# Patient Record
Sex: Male | Born: 1950
Health system: Southern US, Community
[De-identification: ages and names within clinical notes are randomized; demographics above are authoritative.]

## PROBLEM LIST (undated history)

## (undated) DIAGNOSIS — N400 Enlarged prostate without lower urinary tract symptoms: Secondary | ICD-10-CM

## (undated) DIAGNOSIS — I10 Essential (primary) hypertension: Secondary | ICD-10-CM

## (undated) DIAGNOSIS — E119 Type 2 diabetes mellitus without complications: Secondary | ICD-10-CM

## (undated) DIAGNOSIS — R809 Proteinuria, unspecified: Secondary | ICD-10-CM

## (undated) DIAGNOSIS — D573 Sickle-cell trait: Secondary | ICD-10-CM

## (undated) DIAGNOSIS — E782 Mixed hyperlipidemia: Secondary | ICD-10-CM

---

## 1997-11-07 ENCOUNTER — Ambulatory Visit (HOSPITAL_COMMUNITY): Admission: RE | Admit: 1997-11-07 | Discharge: 1997-11-07 | Payer: Self-pay | Admitting: Gastroenterology

## 1998-08-20 ENCOUNTER — Encounter: Admission: RE | Admit: 1998-08-20 | Discharge: 1998-11-18 | Payer: Self-pay | Admitting: Family Medicine

## 1998-12-03 ENCOUNTER — Encounter: Admission: RE | Admit: 1998-12-03 | Discharge: 1999-03-03 | Payer: Self-pay | Admitting: Family Medicine

## 1999-07-05 ENCOUNTER — Encounter: Payer: Self-pay | Admitting: Gastroenterology

## 1999-07-05 ENCOUNTER — Ambulatory Visit (HOSPITAL_COMMUNITY): Admission: RE | Admit: 1999-07-05 | Discharge: 1999-07-05 | Payer: Self-pay | Admitting: Gastroenterology

## 2000-10-05 ENCOUNTER — Encounter: Admission: RE | Admit: 2000-10-05 | Discharge: 2000-10-05 | Payer: Self-pay | Admitting: Surgery

## 2000-10-05 ENCOUNTER — Encounter: Payer: Self-pay | Admitting: Surgery

## 2001-03-20 ENCOUNTER — Emergency Department (HOSPITAL_COMMUNITY): Admission: EM | Admit: 2001-03-20 | Discharge: 2001-03-21 | Payer: Self-pay | Admitting: Emergency Medicine

## 2003-06-18 ENCOUNTER — Emergency Department (HOSPITAL_COMMUNITY): Admission: EM | Admit: 2003-06-18 | Discharge: 2003-06-18 | Payer: Self-pay | Admitting: Emergency Medicine

## 2013-05-26 ENCOUNTER — Encounter (HOSPITAL_COMMUNITY): Payer: Self-pay | Admitting: Emergency Medicine

## 2013-05-26 ENCOUNTER — Emergency Department (HOSPITAL_COMMUNITY)
Admission: EM | Admit: 2013-05-26 | Discharge: 2013-05-26 | Disposition: A | Discharge status: Home / Self Care | Payer: Self-pay | Attending: Emergency Medicine | Admitting: Emergency Medicine

## 2013-05-26 DIAGNOSIS — H6093 Unspecified otitis externa, bilateral: Secondary | ICD-10-CM

## 2013-05-26 DIAGNOSIS — H60399 Other infective otitis externa, unspecified ear: Secondary | ICD-10-CM | POA: Insufficient documentation

## 2013-05-26 DIAGNOSIS — Z23 Encounter for immunization: Secondary | ICD-10-CM | POA: Insufficient documentation

## 2013-05-26 DIAGNOSIS — H9190 Unspecified hearing loss, unspecified ear: Secondary | ICD-10-CM | POA: Insufficient documentation

## 2013-05-26 MED ORDER — OFLOXACIN 0.3 % OT SOLN: 10.0000 [drp] | Freq: Every day | OTIC | Status: DC

## 2013-05-26 MED ORDER — TETANUS-DIPHTH-ACELL PERTUSSIS 5-2.5-18.5 LF-MCG/0.5 IM SUSP
0.5000 mL | Freq: Once | INTRAMUSCULAR | Status: AC
  Administered 2013-05-26: 0.5 mL via INTRAMUSCULAR
  Filled 2013-05-26: qty 0.5

## 2013-05-26 NOTE — Discharge Instructions (Signed)
1. Medications: ofloxacin ear drops, usual home medications 2. Treatment: rest, drink plenty of fluids, after abx is complete wash ears with bulb syringe and 1/2 water + 1/2 peroxide 3. Follow Up: Please followup with your primary doctor for discussion of your diagnoses and further evaluation after today's visit;  Otitis Externa Otitis externa is a bacterial or fungal infection of the outer ear canal. This is the area from the eardrum to the outside of the ear. Otitis externa is sometimes called "swimmer's ear." CAUSES  Possible causes of infection include:  Swimming in dirty water.  Moisture remaining in the ear after swimming or bathing.  Mild injury (trauma) to the ear.  Objects stuck in the ear (foreign body).  Cuts or scrapes (abrasions) on the outside of the ear. SYMPTOMS  The first symptom of infection is often itching in the ear canal. Later signs and symptoms may include swelling and redness of the ear canal, ear pain, and yellowish-white fluid (pus) coming from the ear. The ear pain may be worse when pulling on the earlobe. DIAGNOSIS  Your caregiver will perform a physical exam. A sample of fluid may be taken from the ear and examined for bacteria or fungi. TREATMENT  Antibiotic ear drops are often given for 10 to 14 days. Treatment may also include pain medicine or corticosteroids to reduce itching and swelling. PREVENTION   Keep your ear dry. Use the corner of a towel to absorb water out of the ear canal after swimming or bathing.  Avoid scratching or putting objects inside your ear. This can damage the ear canal or remove the protective wax that lines the canal. This makes it easier for bacteria and fungi to grow.  Avoid swimming in lakes, polluted water, or poorly chlorinated pools.  You may use ear drops made of rubbing alcohol and vinegar after swimming. Combine equal parts of white vinegar and alcohol in a bottle. Put 3 or 4 drops into each ear after swimming. HOME  CARE INSTRUCTIONS   Apply antibiotic ear drops to the ear canal as prescribed by your caregiver.  Only take over-the-counter or prescription medicines for pain, discomfort, or fever as directed by your caregiver.  If you have diabetes, follow any additional treatment instructions from your caregiver.  Keep all follow-up appointments as directed by your caregiver. SEEK MEDICAL CARE IF:   You have a fever.  Your ear is still red, swollen, painful, or draining pus after 3 days.  Your redness, swelling, or pain gets worse.  You have a severe headache.  You have redness, swelling, pain, or tenderness in the area behind your ear. MAKE SURE YOU:   Understand these instructions.  Will watch your condition.  Will get help right away if you are not doing well or get worse. Document Released: 04/14/2005 Document Revised: 07/07/2011 Document Reviewed: 05/01/2011 [facility] Patient Information 2014 [place].

## 2013-05-26 NOTE — ED Notes (Signed)
PT ambulated with baseline gait; VSS; A&Ox3; no signs of distress; respirations even and unlabored; skin warm and dry; no questions upon discharge.  

## 2013-05-26 NOTE — ED Notes (Signed)
Pt has been using qtips, bobby pins, and hydrogen peroxide to clean ears. Pt says he just woke up and felt congestion in ears. External ear canal is scrapped and bleeding and scabbed.

## 2013-05-26 NOTE — ED Provider Notes (Signed)
CSN: [id number]     Arrival date & time 05/26/13  1538 History  This chart was scribed for non-physician practitioner Abigail Butts, PA-C, working with Virgel Manifold, MD by Zettie Pho, ED Scribe. This patient was seen in room TR11C/TR11C and the patient's care was started at 4:31 PM.    Chief Complaint  Patient presents with  . Otalgia   The history is provided by the patient and medical records. No language interpreter was used.   HPI Comments: Miguel Boone is a 63 y.o. male who presents to the Emergency Department complaining of a constant pain and a congestion/swelling-like feeling in the bilateral ears with associated mildly decreased hearing onset yesterday. Patient states that a squirrel recently died in his attic and expresses that the smell may be the cause of his symptoms. Patient reports using hydrogen peroxide, Q-tips, and bobby pins in the ears to clean them with mild, temporary relief. He states that he left the peroxide in his ears over night. Patient presents with scabbing secondary to his attempts to clean them. He denies drainage from the area. Patient states that he does not swim and denies getting any water in his ears recently. He denies nasal congestion, sore throat, fever, chills. He states that he does not know when his last tetanus vaccination was. Patient has no other pertinent medical history.    History reviewed. No pertinent past medical history. History reviewed. No pertinent past surgical history. History reviewed. No pertinent family history. History  Substance Use Topics  . Smoking status: Never Smoker   . Smokeless tobacco: Not on file  . Alcohol Use: No    Review of Systems  Constitutional: Negative for fever and chills.  HENT: Positive for congestion (ears), ear pain and hearing loss. Negative for ear discharge and sore throat.   All other systems reviewed and are negative.    Allergies  Review of patient's allergies indicates no known  allergies.  Home Medications   Current Outpatient Rx  Name  Route  Sig  Dispense  Refill  . ofloxacin (FLOXIN) 0.3 % otic solution   Both Ears   Place 10 drops into both ears daily.   5 mL   0     Triage Vitals: BP 135/73  Pulse 90  Temp(Src) 97.9 F (36.6 C) (Oral)  Resp 18  SpO2 99%  Physical Exam  Nursing note and vitals reviewed. Constitutional: He appears well-developed and well-nourished. No distress.  Awake, alert, nontoxic appearance  HENT:  Head: Normocephalic and atraumatic.  Right Ear: There is swelling and tenderness.  Left Ear: There is swelling and tenderness.  Mouth/Throat: Oropharynx is clear and moist. No oropharyngeal exudate.  Bilateral maceration to the bilateral ear canals with erythema, mild drainage. Neither canal is occluded, but TMs are only partially visible. There are visible abrasions and scratches to the ear canal. No mastoid tenderness. Hearing grossly intact   Eyes: Conjunctivae are normal. No scleral icterus.  Neck: Normal range of motion. Neck supple.  Cardiovascular: Normal rate, regular rhythm, normal heart sounds and intact distal pulses.   No murmur heard. No tachycardia  Pulmonary/Chest: Effort normal and breath sounds normal. No respiratory distress. He has no wheezes.  Abdominal: Soft. Bowel sounds are normal. There is no tenderness.  Musculoskeletal: Normal range of motion. He exhibits no edema.  Neurological: He is alert.  Speech is clear and goal oriented Moves extremities without ataxia  Skin: Skin is warm and dry. He is not diaphoretic.  Psychiatric: He has a  normal mood and affect.    ED Course  Procedures (including critical care time)  DIAGNOSTIC STUDIES: Oxygen Saturation is 99% on room air, normal by my interpretation.    COORDINATION OF CARE: 4:34 PM- Discussed that there appears to be an infection on the external part of the ear. Will discharge patient with antibiotic ear drops. Will order a tetanus vaccination.  Advised patient to stop using bobby pins in the ears. Advised patient to use a mixture of hydrogen peroxide and water to rinse the ears after the infection has healed. Advised patient to follow up with his PCP to ensure proper healing. Return precautions given. Discussed treatment plan with patient at bedside and patient verbalized agreement.     Labs Review Labs Reviewed - No data to display Imaging Review No results found.  EKG Interpretation   None       MDM   1. Otitis externa of both ears      Miguel Boone presents with bilateral otalgia after using a bobby pin to clean his ears.  He then applied peroxide to his ear canal and went to sleep with it in.  On exam pt with macerated skin in the canal and noted abrasions to the walls of the canal.  Will update tetanus.  No canal occlusion, swelling not severe enough to require ear wick.  Pt afebrile in NAD.  Exam non concerning for mastoiditis, cellulitis or malignant OE.  Dc with ofloxacin script.  Advised pt to follow up in 2-3 days with PCP especially if no improvement with treatment or no complete resolution by 7 days. Pt is to return to ED if he develops fever, pain in the mastoid region or loss of hearing.    It has been determined that no acute conditions requiring further emergency intervention are present at this time. The patient/guardian have been advised of the diagnosis and plan. We have discussed signs and symptoms that warrant return to the ED, such as changes or worsening in symptoms.   Vital signs are stable at discharge.   BP 135/73  Pulse 90  Temp(Src) 97.9 F (36.6 C) (Oral)  Resp 18  SpO2 99%  Patient/guardian has voiced understanding and agreed to follow-up with the PCP or specialist.      Abigail Butts, PA-C 05/26/13 1654

## 2013-05-27 NOTE — ED Provider Notes (Signed)
Medical screening examination/treatment/procedure(s) were performed by non-physician practitioner and as supervising physician I was immediately available for consultation/collaboration.  EKG Interpretation   None        Virgel Manifold, MD 05/27/13 [phone]

## 2015-03-16 DIAGNOSIS — E782 Mixed hyperlipidemia: Secondary | ICD-10-CM | POA: Diagnosis not present

## 2015-03-16 DIAGNOSIS — Z0000 Encounter for general adult medical examination without abnormal findings: Secondary | ICD-10-CM | POA: Diagnosis not present

## 2015-03-16 DIAGNOSIS — Z6841 Body Mass Index (BMI) 40.0 and over, adult: Secondary | ICD-10-CM | POA: Diagnosis not present

## 2015-04-26 DIAGNOSIS — E785 Hyperlipidemia, unspecified: Secondary | ICD-10-CM | POA: Diagnosis not present

## 2015-04-26 DIAGNOSIS — E118 Type 2 diabetes mellitus with unspecified complications: Secondary | ICD-10-CM | POA: Diagnosis not present

## 2015-05-03 DIAGNOSIS — Z0000 Encounter for general adult medical examination without abnormal findings: Secondary | ICD-10-CM | POA: Diagnosis not present

## 2015-05-03 DIAGNOSIS — I10 Essential (primary) hypertension: Secondary | ICD-10-CM | POA: Diagnosis not present

## 2015-05-03 DIAGNOSIS — E782 Mixed hyperlipidemia: Secondary | ICD-10-CM | POA: Diagnosis not present

## 2015-05-03 DIAGNOSIS — Z23 Encounter for immunization: Secondary | ICD-10-CM | POA: Diagnosis not present

## 2015-05-03 DIAGNOSIS — N400 Enlarged prostate without lower urinary tract symptoms: Secondary | ICD-10-CM | POA: Diagnosis not present

## 2015-09-22 ENCOUNTER — Emergency Department (HOSPITAL_COMMUNITY)
Admission: EM | Admit: 2015-09-22 | Discharge: 2015-09-22 | Disposition: A | Discharge status: Home / Self Care | Payer: Medicare HMO | Attending: Emergency Medicine | Admitting: Emergency Medicine

## 2015-09-22 ENCOUNTER — Emergency Department (HOSPITAL_COMMUNITY): Payer: Medicare HMO

## 2015-09-22 ENCOUNTER — Encounter (HOSPITAL_COMMUNITY): Payer: Self-pay

## 2015-09-22 DIAGNOSIS — Z792 Long term (current) use of antibiotics: Secondary | ICD-10-CM | POA: Diagnosis not present

## 2015-09-22 DIAGNOSIS — R05 Cough: Secondary | ICD-10-CM | POA: Diagnosis not present

## 2015-09-22 DIAGNOSIS — R059 Cough, unspecified: Secondary | ICD-10-CM

## 2015-09-22 DIAGNOSIS — B369 Superficial mycosis, unspecified: Secondary | ICD-10-CM | POA: Diagnosis not present

## 2015-09-22 DIAGNOSIS — H6240 Otitis externa in other diseases classified elsewhere, unspecified ear: Secondary | ICD-10-CM

## 2015-09-22 MED ORDER — BENZONATATE 100 MG PO CAPS: 100.0000 mg | ORAL_CAPSULE | Freq: Three times a day (TID) | ORAL | Status: DC

## 2015-09-22 MED ORDER — CLOTRIMAZOLE 1 % EX SOLN: 1.0000 "application " | Freq: Two times a day (BID) | CUTANEOUS | Status: DC

## 2015-09-22 MED ORDER — CETIRIZINE HCL 10 MG PO CAPS: 10.0000 mg | ORAL_CAPSULE | Freq: Every day | ORAL | Status: DC

## 2015-09-22 NOTE — Discharge Instructions (Signed)
Cough, Adult A cough helps to clear your throat and lungs. A cough may last only 2-3 weeks (acute), or it may last longer than 8 weeks (chronic). Many different things can cause a cough. A cough may be a sign of an illness or another medical condition. HOME CARE  Pay attention to any changes in your cough.  Take medicines only as told by your doctor.  If you were prescribed an antibiotic medicine, take it as told by your doctor. Do not stop taking it even if you start to feel better.  Talk with your doctor before you try using a cough medicine.  Drink enough fluid to keep your pee (urine) clear or pale yellow.  If the air is dry, use a cold steam vaporizer or humidifier in your home.  Stay away from things that make you cough at work or at home.  If your cough is worse at night, try using extra pillows to raise your head up higher while you sleep.  Do not smoke, and try not to be around smoke. If you need help quitting, ask your doctor.  Do not have caffeine.  Do not drink alcohol.  Rest as needed. GET HELP IF:  You have new problems (symptoms).  You cough up yellow fluid (pus).  Your cough does not get better after 2-3 weeks, or your cough gets worse.  Medicine does not help your cough and you are not sleeping well.  You have pain that gets worse or pain that is not helped with medicine.  You have a fever.  You are losing weight and you do not know why.  You have night sweats. GET HELP RIGHT AWAY IF:  You cough up blood.  You have trouble breathing.  Your heartbeat is very fast.   This information is not intended to replace advice given to you by your health care provider. Make sure you discuss any questions you have with your health care provider.    Apply antifungal solution to ears twice daily for 10-14 days. Follow up with your PCP for re-evaluation. Take Tessalon as needed for cough. Recommend taking Zyrtec daily for seasonal allergies. Return to the ED if  you experience severe worsening of your symptoms, fevers, chills difficulty breathing.

## 2015-09-22 NOTE — ED Provider Notes (Signed)
CSN: [id number]     Arrival date & time 09/22/15  1542 History   First MD Initiated Contact with Patient 09/22/15 1603     Chief Complaint  Patient presents with  . Cough     (Consider location/radiation/quality/duration/timing/severity/associated sxs/prior Treatment) HPI   Miguel Boone is a 65 y.o M with no significant pmhx who presents to the ED today c/o cough and itchy ears.Patient states that for the last 3 days he has had a productive cough with clear mucus. He states the cough is worse at nighttime and has been keeping him up. He has tried taking Robitussin without relief of his symptoms. He denies any fevers, chills, sore throat, rhinorrhea, chest pain or shortness of breath. Patient also states that about 6 months ago he had a bilateral ear infection. He was given antibiotics which seemed to help the pain but since that time he has had significant itchiness in both of his ears. He denies any drainage from his ears or pain in the bone behind his ear.  History reviewed. No pertinent past medical history. History reviewed. No pertinent past surgical history. No family history on file. Social History  Substance Use Topics  . Smoking status: Never Smoker   . Smokeless tobacco: None  . Alcohol Use: No    Review of Systems  All other systems reviewed and are negative.     Allergies  Review of patient's allergies indicates no known allergies.  Home Medications   Prior to Admission medications   Medication Sig Start Date End Date Taking? Authorizing Provider  benzonatate (TESSALON) 100 MG capsule Take 1 capsule (100 mg total) by mouth every 8 (eight) hours. 09/22/15   Samantha Tripp [place], PA-C  Cetirizine HCl 10 MG CAPS Take 1 capsule (10 mg total) by mouth daily. 09/22/15   Samantha Tripp Dowless, PA-C  clotrimazole (LOTRIMIN) 1 % external solution Apply 1 application topically 2 (two) times daily. 09/22/15   Samantha Tripp Dowless, PA-C  ofloxacin (FLOXIN) 0.3 % otic  solution Place 10 drops into both ears daily. 05/26/13   Hannah Muthersbaugh, PA-C   BP 141/78 mmHg  Pulse 66  Temp(Src) 98.5 F (36.9 C) (Oral)  Resp 16  Ht 6\' 1"  (1.854 m)  Wt 138.857 kg  BMI 40.40 kg/m2  SpO2 95% Physical Exam  Constitutional: He is oriented to person, place, and time. He appears well-developed and well-nourished. No distress.  HENT:  Head: Normocephalic and atraumatic.  Nose: Nose normal.  Mouth/Throat: No oropharyngeal exudate.  greyish white debris in bilateral ear canals. No canal swelling. TMs clear bilaterally.   Eyes: Conjunctivae are normal. Right eye exhibits no discharge. Left eye exhibits no discharge. No scleral icterus.  Neck: Neck supple.  Cardiovascular: Normal rate, regular rhythm, normal heart sounds and intact distal pulses.  Exam reveals no gallop and no friction rub.   No murmur heard. Pulmonary/Chest: Effort normal and breath sounds normal. No respiratory distress. He has no wheezes. He has no rales. He exhibits no tenderness.  Lymphadenopathy:    He has no cervical adenopathy.  Neurological: He is alert and oriented to person, place, and time. Coordination normal.  Skin: Skin is warm and dry. No rash noted. He is not diaphoretic. No erythema. No pallor.  Psychiatric: He has a normal mood and affect. His behavior is normal.  Nursing note and vitals reviewed.   ED Course  Procedures (including critical care time) Labs Review Labs Reviewed - No data to display  Imaging Review Dg Chest  2 View  09/22/2015  CLINICAL DATA:  Cough EXAM: CHEST  2 VIEW COMPARISON:  06/18/2003 chest radiograph. FINDINGS: Stable cardiomediastinal silhouette with normal heart size. No pneumothorax. No pleural effusion. Lungs appear clear, with no acute consolidative airspace disease and no pulmonary edema. IMPRESSION: No active cardiopulmonary disease. Electronically Signed   By: Ilona Sorrel M.D.   On: 09/22/2015 16:35   I have personally reviewed and evaluated  these images and lab results as part of my medical decision-making.   EKG Interpretation None      MDM   Final diagnoses:  Cough  Otomycosis   Otherwise healthy 64 red male presents the ED today complaining of productive cough with clear sputum onset 2 days ago. Patient appears well in the ED, nontoxic, nonseptic appearing. Vital signs are stable. Lungs are clear to auscultation bilaterally. Chest x-ray negative for infiltrate. Suspect URI, likely viral or allergic in etiology. No antibiotic indication at this time. Will prescribe Tessalon Perles for symptomatic relief. Patient ALSO complaining of pruritus in his bilateral ears ongoing for 6 months after being treated with antibiotics for otitis media. There is grayish white debris in his bilateral ear canals without swelling. Since this followed antibiotics it's possible this is a fungal infection. Will prescribe antifungal otic suspension. Recommend follow-up with PCP for further evaluation. Doubt that this is bacterial as that is not painful and there is no swelling. No mastoid tenderness. No evidence of mastoiditis or meningitis. Discussed treatment plan with patient who is agreeable. Return precautions outlined in patient discharge instructions.    Dondra Spry [place], PA-C 09/22/15 2135  Ezequiel Essex, MD 09/22/15 2138

## 2015-09-22 NOTE — ED Notes (Signed)
Patient is alert and orientedx4.  Patient was explained discharge instructions and they understood them with no questions.   

## 2015-09-22 NOTE — ED Notes (Signed)
Patient here with dry cough x 2 days with ears feeling stopped up. Cough worse at night when supine

## 2015-09-22 NOTE — ED Notes (Signed)
Patient transported to X-ray 

## 2015-10-29 DIAGNOSIS — Z09 Encounter for follow-up examination after completed treatment for conditions other than malignant neoplasm: Secondary | ICD-10-CM | POA: Diagnosis not present

## 2016-05-29 DIAGNOSIS — Z0000 Encounter for general adult medical examination without abnormal findings: Secondary | ICD-10-CM | POA: Diagnosis not present

## 2016-05-29 DIAGNOSIS — I10 Essential (primary) hypertension: Secondary | ICD-10-CM | POA: Diagnosis not present

## 2016-05-29 DIAGNOSIS — Z125 Encounter for screening for malignant neoplasm of prostate: Secondary | ICD-10-CM | POA: Diagnosis not present

## 2016-05-29 DIAGNOSIS — E782 Mixed hyperlipidemia: Secondary | ICD-10-CM | POA: Diagnosis not present

## 2016-05-29 DIAGNOSIS — E119 Type 2 diabetes mellitus without complications: Secondary | ICD-10-CM | POA: Diagnosis not present

## 2016-06-05 DIAGNOSIS — E118 Type 2 diabetes mellitus with unspecified complications: Secondary | ICD-10-CM | POA: Diagnosis not present

## 2016-06-05 DIAGNOSIS — E782 Mixed hyperlipidemia: Secondary | ICD-10-CM | POA: Diagnosis not present

## 2016-06-05 DIAGNOSIS — E1122 Type 2 diabetes mellitus with diabetic chronic kidney disease: Secondary | ICD-10-CM | POA: Diagnosis not present

## 2016-06-05 DIAGNOSIS — I10 Essential (primary) hypertension: Secondary | ICD-10-CM | POA: Diagnosis not present

## 2016-07-15 DIAGNOSIS — M25562 Pain in left knee: Secondary | ICD-10-CM | POA: Diagnosis not present

## 2016-07-15 DIAGNOSIS — M1712 Unilateral primary osteoarthritis, left knee: Secondary | ICD-10-CM | POA: Diagnosis not present

## 2016-07-16 DIAGNOSIS — M1990 Unspecified osteoarthritis, unspecified site: Secondary | ICD-10-CM | POA: Diagnosis not present

## 2016-07-16 DIAGNOSIS — M25562 Pain in left knee: Secondary | ICD-10-CM | POA: Diagnosis not present

## 2017-01-12 DIAGNOSIS — E782 Mixed hyperlipidemia: Secondary | ICD-10-CM | POA: Diagnosis not present

## 2017-01-12 DIAGNOSIS — E118 Type 2 diabetes mellitus with unspecified complications: Secondary | ICD-10-CM | POA: Diagnosis not present

## 2017-01-19 DIAGNOSIS — I129 Hypertensive chronic kidney disease with stage 1 through stage 4 chronic kidney disease, or unspecified chronic kidney disease: Secondary | ICD-10-CM | POA: Diagnosis not present

## 2017-01-19 DIAGNOSIS — E1122 Type 2 diabetes mellitus with diabetic chronic kidney disease: Secondary | ICD-10-CM | POA: Diagnosis not present

## 2017-01-19 DIAGNOSIS — E782 Mixed hyperlipidemia: Secondary | ICD-10-CM | POA: Diagnosis not present

## 2017-01-19 DIAGNOSIS — E1165 Type 2 diabetes mellitus with hyperglycemia: Secondary | ICD-10-CM | POA: Diagnosis not present

## 2017-07-06 DIAGNOSIS — Z125 Encounter for screening for malignant neoplasm of prostate: Secondary | ICD-10-CM | POA: Diagnosis not present

## 2017-07-06 DIAGNOSIS — E782 Mixed hyperlipidemia: Secondary | ICD-10-CM | POA: Diagnosis not present

## 2017-07-06 DIAGNOSIS — E1122 Type 2 diabetes mellitus with diabetic chronic kidney disease: Secondary | ICD-10-CM | POA: Diagnosis not present

## 2017-07-06 DIAGNOSIS — I129 Hypertensive chronic kidney disease with stage 1 through stage 4 chronic kidney disease, or unspecified chronic kidney disease: Secondary | ICD-10-CM | POA: Diagnosis not present

## 2017-07-06 DIAGNOSIS — Z0000 Encounter for general adult medical examination without abnormal findings: Secondary | ICD-10-CM | POA: Diagnosis not present

## 2017-07-13 DIAGNOSIS — N181 Chronic kidney disease, stage 1: Secondary | ICD-10-CM | POA: Diagnosis not present

## 2017-07-13 DIAGNOSIS — I10 Essential (primary) hypertension: Secondary | ICD-10-CM | POA: Diagnosis not present

## 2017-07-13 DIAGNOSIS — R809 Proteinuria, unspecified: Secondary | ICD-10-CM | POA: Diagnosis not present

## 2017-07-13 DIAGNOSIS — N400 Enlarged prostate without lower urinary tract symptoms: Secondary | ICD-10-CM | POA: Diagnosis not present

## 2017-07-13 DIAGNOSIS — E118 Type 2 diabetes mellitus with unspecified complications: Secondary | ICD-10-CM | POA: Diagnosis not present

## 2017-07-13 DIAGNOSIS — E1122 Type 2 diabetes mellitus with diabetic chronic kidney disease: Secondary | ICD-10-CM | POA: Diagnosis not present

## 2017-07-13 DIAGNOSIS — D573 Sickle-cell trait: Secondary | ICD-10-CM | POA: Diagnosis not present

## 2017-07-13 DIAGNOSIS — E782 Mixed hyperlipidemia: Secondary | ICD-10-CM | POA: Diagnosis not present

## 2017-07-14 DIAGNOSIS — Z1212 Encounter for screening for malignant neoplasm of rectum: Secondary | ICD-10-CM | POA: Diagnosis not present

## 2017-07-14 DIAGNOSIS — Z1211 Encounter for screening for malignant neoplasm of colon: Secondary | ICD-10-CM | POA: Diagnosis not present

## 2017-12-01 DIAGNOSIS — Z0100 Encounter for examination of eyes and vision without abnormal findings: Secondary | ICD-10-CM | POA: Diagnosis not present

## 2017-12-01 DIAGNOSIS — H524 Presbyopia: Secondary | ICD-10-CM | POA: Diagnosis not present

## 2018-01-07 DIAGNOSIS — E118 Type 2 diabetes mellitus with unspecified complications: Secondary | ICD-10-CM | POA: Diagnosis not present

## 2018-01-07 DIAGNOSIS — N181 Chronic kidney disease, stage 1: Secondary | ICD-10-CM | POA: Diagnosis not present

## 2018-01-07 DIAGNOSIS — E782 Mixed hyperlipidemia: Secondary | ICD-10-CM | POA: Diagnosis not present

## 2018-01-14 DIAGNOSIS — D573 Sickle-cell trait: Secondary | ICD-10-CM | POA: Diagnosis not present

## 2018-01-14 DIAGNOSIS — E782 Mixed hyperlipidemia: Secondary | ICD-10-CM | POA: Diagnosis not present

## 2018-01-14 DIAGNOSIS — E118 Type 2 diabetes mellitus with unspecified complications: Secondary | ICD-10-CM | POA: Diagnosis not present

## 2018-01-14 DIAGNOSIS — I129 Hypertensive chronic kidney disease with stage 1 through stage 4 chronic kidney disease, or unspecified chronic kidney disease: Secondary | ICD-10-CM | POA: Diagnosis not present

## 2018-01-14 DIAGNOSIS — M25561 Pain in right knee: Secondary | ICD-10-CM | POA: Diagnosis not present

## 2018-01-14 DIAGNOSIS — E1122 Type 2 diabetes mellitus with diabetic chronic kidney disease: Secondary | ICD-10-CM | POA: Diagnosis not present

## 2018-01-14 DIAGNOSIS — I10 Essential (primary) hypertension: Secondary | ICD-10-CM | POA: Diagnosis not present

## 2018-01-25 DIAGNOSIS — M25569 Pain in unspecified knee: Secondary | ICD-10-CM | POA: Diagnosis not present

## 2018-01-25 DIAGNOSIS — M1990 Unspecified osteoarthritis, unspecified site: Secondary | ICD-10-CM | POA: Diagnosis not present

## 2018-01-25 DIAGNOSIS — M170 Bilateral primary osteoarthritis of knee: Secondary | ICD-10-CM | POA: Diagnosis not present

## 2018-01-28 IMAGING — DX DG CHEST 2V
2 series · 2 of 2 positions shown · non-contrast
Comparison: 06/18/2003 chest radiograph.

CLINICAL DATA: Cough

EXAM:
CHEST  2 VIEW

[w chest pa]
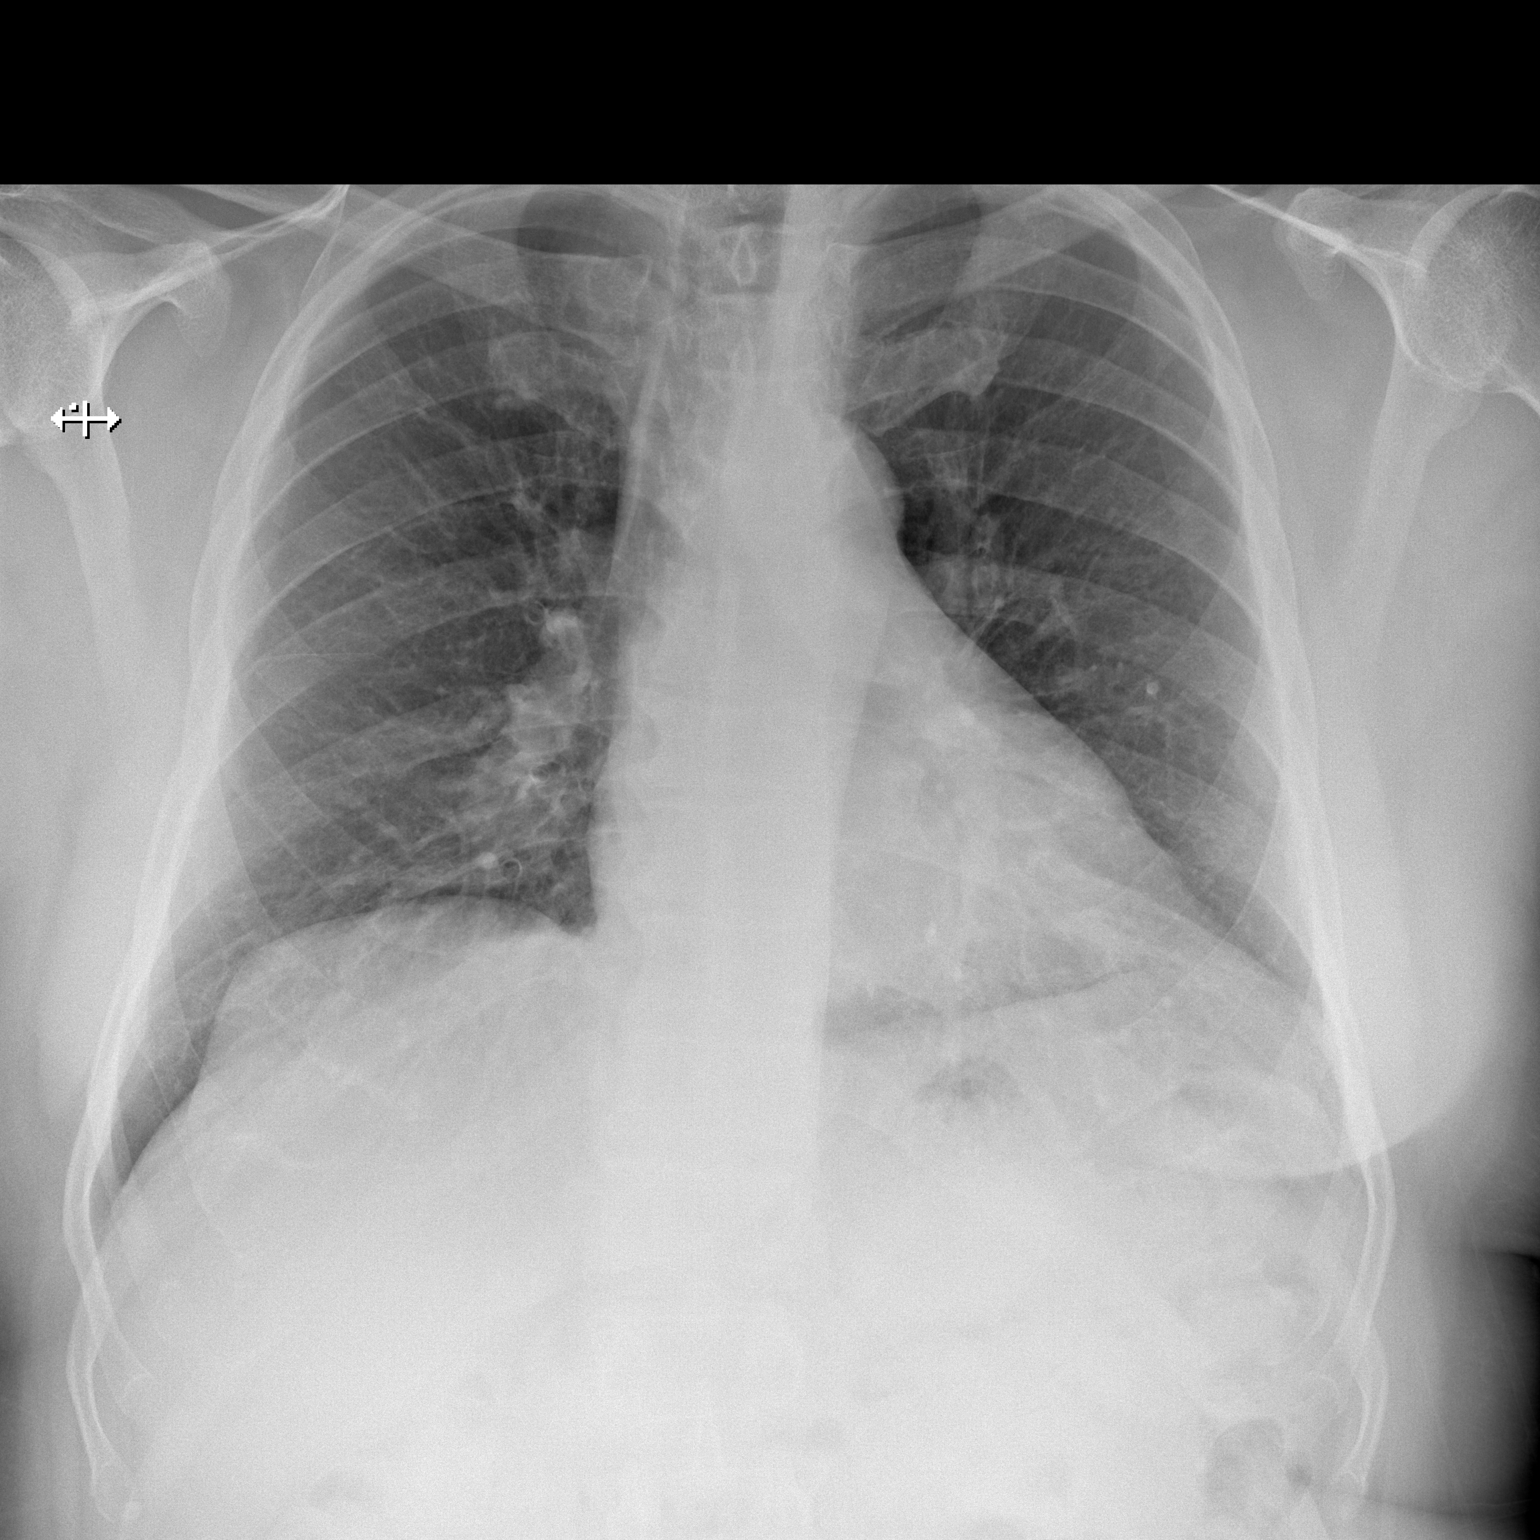

[w chest lat]
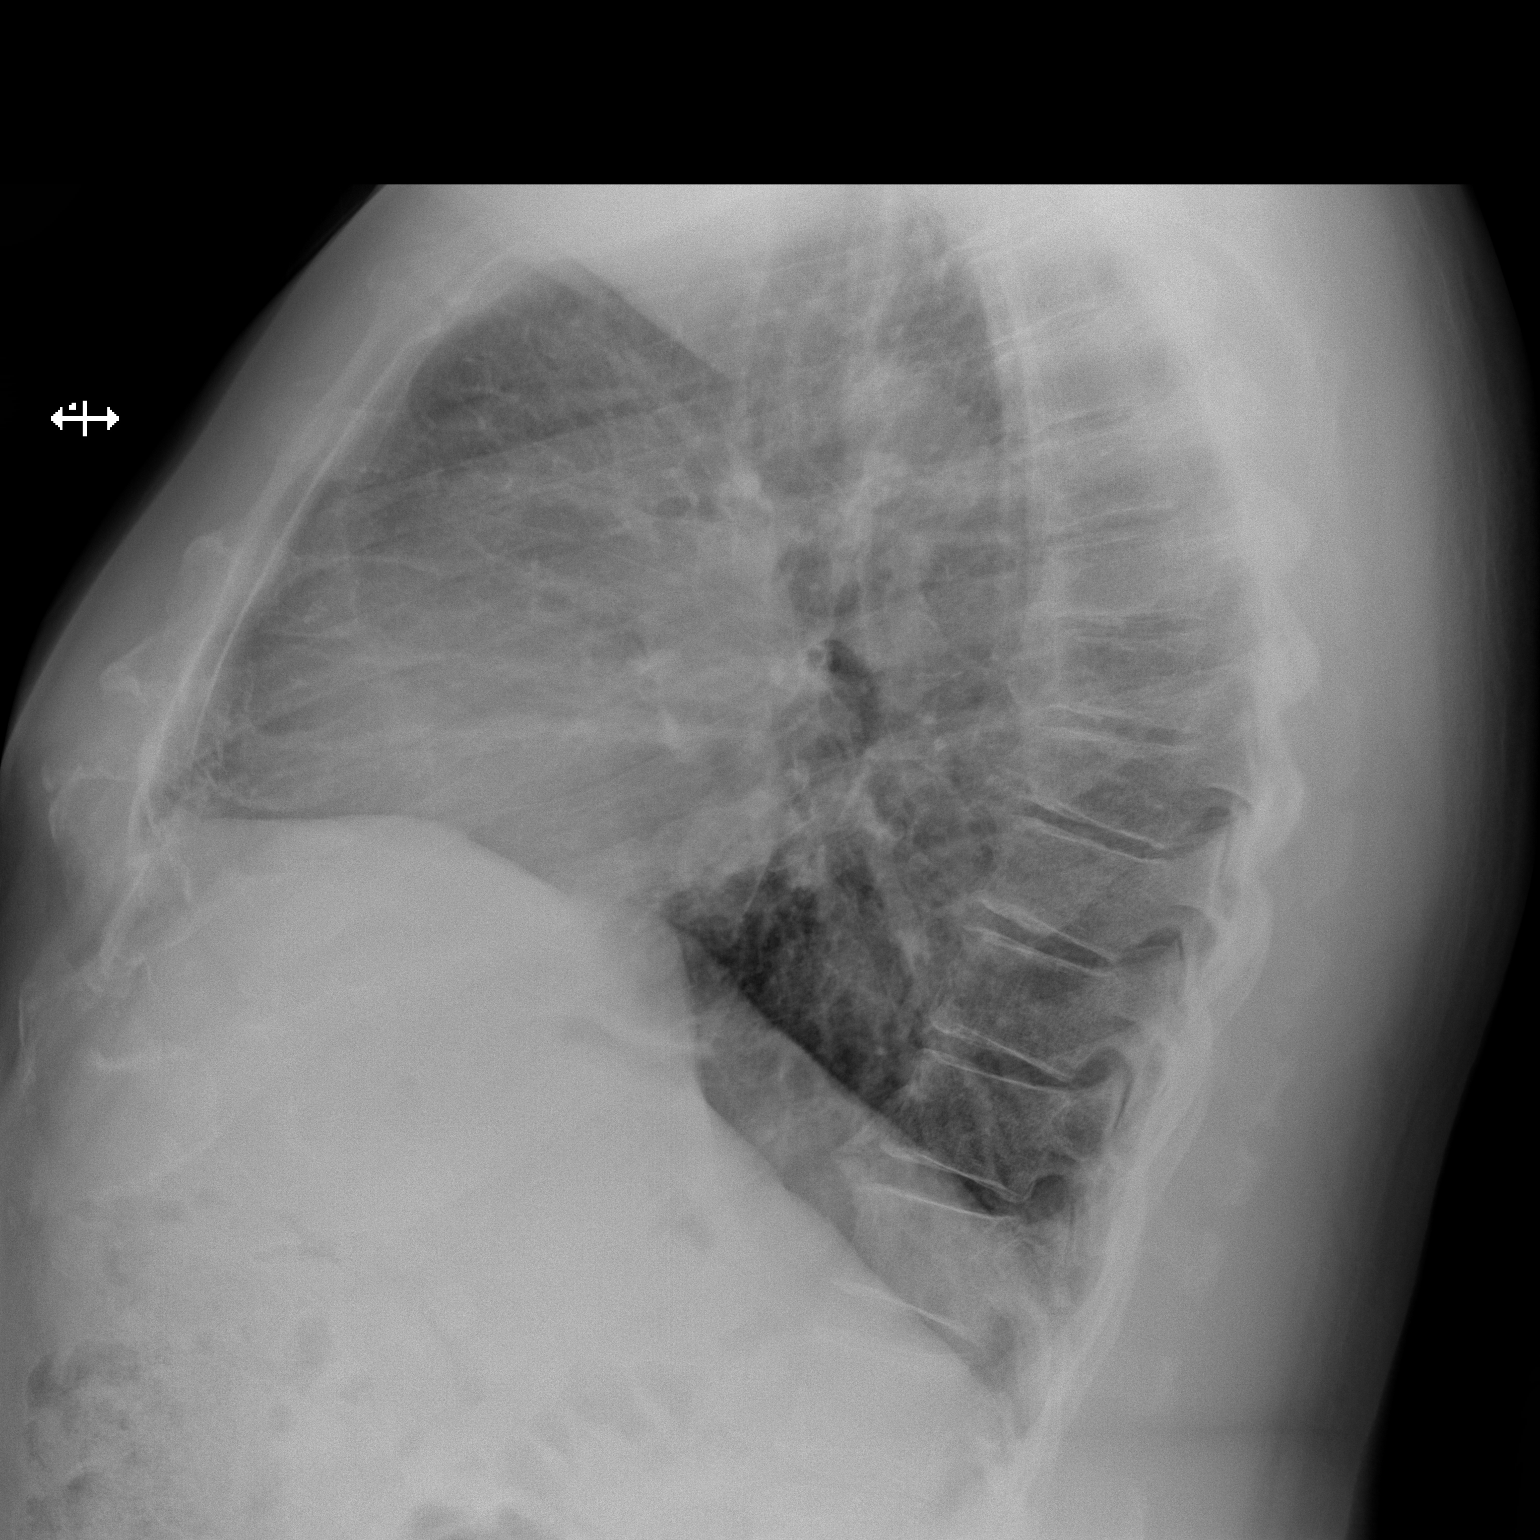

[2 of 2 positions shown; findings below may reference images not displayed]

FINDINGS: Stable cardiomediastinal silhouette with normal heart size. No
pneumothorax. No pleural effusion. Lungs appear clear, with no acute
consolidative airspace disease and no pulmonary edema.
IMPRESSION: No active cardiopulmonary disease.

## 2018-07-22 DIAGNOSIS — Z125 Encounter for screening for malignant neoplasm of prostate: Secondary | ICD-10-CM | POA: Diagnosis not present

## 2018-07-22 DIAGNOSIS — I129 Hypertensive chronic kidney disease with stage 1 through stage 4 chronic kidney disease, or unspecified chronic kidney disease: Secondary | ICD-10-CM | POA: Diagnosis not present

## 2018-07-22 DIAGNOSIS — E1122 Type 2 diabetes mellitus with diabetic chronic kidney disease: Secondary | ICD-10-CM | POA: Diagnosis not present

## 2018-07-22 DIAGNOSIS — E782 Mixed hyperlipidemia: Secondary | ICD-10-CM | POA: Diagnosis not present

## 2018-07-22 DIAGNOSIS — I10 Essential (primary) hypertension: Secondary | ICD-10-CM | POA: Diagnosis not present

## 2018-07-22 DIAGNOSIS — E1165 Type 2 diabetes mellitus with hyperglycemia: Secondary | ICD-10-CM | POA: Diagnosis not present

## 2018-07-22 DIAGNOSIS — D573 Sickle-cell trait: Secondary | ICD-10-CM | POA: Diagnosis not present

## 2018-07-22 DIAGNOSIS — N181 Chronic kidney disease, stage 1: Secondary | ICD-10-CM | POA: Diagnosis not present

## 2018-07-22 DIAGNOSIS — E118 Type 2 diabetes mellitus with unspecified complications: Secondary | ICD-10-CM | POA: Diagnosis not present

## 2018-07-22 DIAGNOSIS — Z0000 Encounter for general adult medical examination without abnormal findings: Secondary | ICD-10-CM | POA: Diagnosis not present

## 2018-08-25 DIAGNOSIS — E1165 Type 2 diabetes mellitus with hyperglycemia: Secondary | ICD-10-CM | POA: Diagnosis not present

## 2019-01-12 DIAGNOSIS — M1990 Unspecified osteoarthritis, unspecified site: Secondary | ICD-10-CM | POA: Diagnosis not present

## 2019-01-12 DIAGNOSIS — M25569 Pain in unspecified knee: Secondary | ICD-10-CM | POA: Diagnosis not present

## 2019-02-17 DIAGNOSIS — I10 Essential (primary) hypertension: Secondary | ICD-10-CM | POA: Diagnosis not present

## 2019-02-17 DIAGNOSIS — E118 Type 2 diabetes mellitus with unspecified complications: Secondary | ICD-10-CM | POA: Diagnosis not present

## 2019-02-24 DIAGNOSIS — E782 Mixed hyperlipidemia: Secondary | ICD-10-CM | POA: Diagnosis not present

## 2019-02-24 DIAGNOSIS — E118 Type 2 diabetes mellitus with unspecified complications: Secondary | ICD-10-CM | POA: Diagnosis not present

## 2019-02-24 DIAGNOSIS — E1165 Type 2 diabetes mellitus with hyperglycemia: Secondary | ICD-10-CM | POA: Diagnosis not present

## 2019-02-24 DIAGNOSIS — Z7189 Other specified counseling: Secondary | ICD-10-CM | POA: Diagnosis not present

## 2019-02-24 DIAGNOSIS — E1122 Type 2 diabetes mellitus with diabetic chronic kidney disease: Secondary | ICD-10-CM | POA: Diagnosis not present

## 2019-02-24 DIAGNOSIS — Z23 Encounter for immunization: Secondary | ICD-10-CM | POA: Diagnosis not present

## 2019-02-24 DIAGNOSIS — I10 Essential (primary) hypertension: Secondary | ICD-10-CM | POA: Diagnosis not present

## 2019-07-28 DIAGNOSIS — E118 Type 2 diabetes mellitus with unspecified complications: Secondary | ICD-10-CM | POA: Diagnosis not present

## 2019-07-28 DIAGNOSIS — E1122 Type 2 diabetes mellitus with diabetic chronic kidney disease: Secondary | ICD-10-CM | POA: Diagnosis not present

## 2019-07-28 DIAGNOSIS — Z0000 Encounter for general adult medical examination without abnormal findings: Secondary | ICD-10-CM | POA: Diagnosis not present

## 2019-07-28 DIAGNOSIS — I10 Essential (primary) hypertension: Secondary | ICD-10-CM | POA: Diagnosis not present

## 2019-07-28 DIAGNOSIS — E782 Mixed hyperlipidemia: Secondary | ICD-10-CM | POA: Diagnosis not present

## 2019-07-28 DIAGNOSIS — Z125 Encounter for screening for malignant neoplasm of prostate: Secondary | ICD-10-CM | POA: Diagnosis not present

## 2019-07-28 DIAGNOSIS — E1165 Type 2 diabetes mellitus with hyperglycemia: Secondary | ICD-10-CM | POA: Diagnosis not present

## 2019-08-04 DIAGNOSIS — D573 Sickle-cell trait: Secondary | ICD-10-CM | POA: Diagnosis not present

## 2019-08-04 DIAGNOSIS — E1165 Type 2 diabetes mellitus with hyperglycemia: Secondary | ICD-10-CM | POA: Diagnosis not present

## 2019-08-04 DIAGNOSIS — E118 Type 2 diabetes mellitus with unspecified complications: Secondary | ICD-10-CM | POA: Diagnosis not present

## 2019-08-04 DIAGNOSIS — E1122 Type 2 diabetes mellitus with diabetic chronic kidney disease: Secondary | ICD-10-CM | POA: Diagnosis not present

## 2019-08-04 DIAGNOSIS — Z6841 Body Mass Index (BMI) 40.0 and over, adult: Secondary | ICD-10-CM | POA: Diagnosis not present

## 2019-08-04 DIAGNOSIS — N181 Chronic kidney disease, stage 1: Secondary | ICD-10-CM | POA: Diagnosis not present

## 2019-08-04 DIAGNOSIS — I10 Essential (primary) hypertension: Secondary | ICD-10-CM | POA: Diagnosis not present

## 2019-08-04 DIAGNOSIS — Z0000 Encounter for general adult medical examination without abnormal findings: Secondary | ICD-10-CM | POA: Diagnosis not present

## 2019-08-04 DIAGNOSIS — I129 Hypertensive chronic kidney disease with stage 1 through stage 4 chronic kidney disease, or unspecified chronic kidney disease: Secondary | ICD-10-CM | POA: Diagnosis not present

## 2019-08-04 DIAGNOSIS — E782 Mixed hyperlipidemia: Secondary | ICD-10-CM | POA: Diagnosis not present

## 2019-08-30 ENCOUNTER — Encounter (HOSPITAL_COMMUNITY): Payer: Self-pay | Admitting: Emergency Medicine

## 2019-08-30 ENCOUNTER — Telehealth: Payer: Self-pay | Admitting: Unknown Physician Specialty

## 2019-08-30 ENCOUNTER — Emergency Department (HOSPITAL_COMMUNITY)
Admission: EM | Admit: 2019-08-30 | Discharge: 2019-08-30 | Disposition: A | Discharge status: Home / Self Care | Payer: Medicare HMO | Attending: Emergency Medicine | Admitting: Emergency Medicine

## 2019-08-30 ENCOUNTER — Other Ambulatory Visit: Payer: Self-pay

## 2019-08-30 ENCOUNTER — Emergency Department (HOSPITAL_COMMUNITY): Payer: Medicare HMO

## 2019-08-30 ENCOUNTER — Other Ambulatory Visit: Payer: Self-pay | Admitting: Adult Health

## 2019-08-30 DIAGNOSIS — U071 COVID-19: Secondary | ICD-10-CM | POA: Diagnosis not present

## 2019-08-30 DIAGNOSIS — R5383 Other fatigue: Secondary | ICD-10-CM | POA: Diagnosis present

## 2019-08-30 DIAGNOSIS — R05 Cough: Secondary | ICD-10-CM | POA: Diagnosis not present

## 2019-08-30 LAB — CBC WITH DIFFERENTIAL/PLATELET
Abs Immature Granulocytes: 0.01 10*3/uL (ref 0.00–0.07)
Basophils Absolute: 0 10*3/uL (ref 0.0–0.1)
Basophils Relative: 0 %
Eosinophils Absolute: 0 10*3/uL (ref 0.0–0.5)
Eosinophils Relative: 0 %
HCT: 44.6 % (ref 39.0–52.0)
Hemoglobin: 14.4 g/dL (ref 13.0–17.0)
Immature Granulocytes: 0 %
Lymphocytes Relative: 26 %
Lymphs Abs: 1 10*3/uL (ref 0.7–4.0)
MCH: 26.1 pg (ref 26.0–34.0)
MCHC: 32.3 g/dL (ref 30.0–36.0)
MCV: 80.8 fL (ref 80.0–100.0)
Monocytes Absolute: 0.4 10*3/uL (ref 0.1–1.0)
Monocytes Relative: 10 %
Neutro Abs: 2.3 10*3/uL (ref 1.7–7.7)
Neutrophils Relative %: 64 %
Platelets: 184 10*3/uL (ref 150–400)
RBC: 5.52 MIL/uL (ref 4.22–5.81)
RDW: 15.5 % (ref 11.5–15.5)
WBC: 3.7 10*3/uL — ABNORMAL LOW (ref 4.0–10.5)
nRBC: 0 % (ref 0.0–0.2)

## 2019-08-30 LAB — COMPREHENSIVE METABOLIC PANEL
ALT: 30 U/L (ref 0–44)
AST: 32 U/L (ref 15–41)
Albumin: 3.9 g/dL (ref 3.5–5.0)
Alkaline Phosphatase: 40 U/L (ref 38–126)
Anion gap: 7 (ref 5–15)
BUN: 13 mg/dL (ref 8–23)
CO2: 26 mmol/L (ref 22–32)
Calcium: 9 mg/dL (ref 8.9–10.3)
Chloride: 104 mmol/L (ref 98–111)
Creatinine, Ser: 0.87 mg/dL (ref 0.61–1.24)
GFR calc Af Amer: >60 mL/min (ref >60)
GFR calc non Af Amer: >60 mL/min (ref >60)
Glucose, Bld: 163 mg/dL — ABNORMAL HIGH (ref 70–99)
Potassium: 3.8 mmol/L (ref 3.5–5.1)
Sodium: 137 mmol/L (ref 135–145)
Total Bilirubin: 1.2 mg/dL (ref 0.3–1.2)
Total Protein: 7.6 g/dL (ref 6.5–8.1)

## 2019-08-30 LAB — RESPIRATORY PANEL BY RT PCR (FLU A&B, COVID)
Influenza A by PCR: NEGATIVE
Influenza B by PCR: NEGATIVE
SARS Coronavirus 2 by RT PCR: POSITIVE — AB

## 2019-08-30 NOTE — Discharge Instructions (Addendum)
Take Tylenol for fever drink plenty of fluids return to the emergency department if any problems and follow-up with your doctor next week.  Miguel Boone should contact you for possible treatment for the Covid at the hospital but not overnight

## 2019-08-30 NOTE — Progress Notes (Signed)
  I connected by phone with Miguel Boone on 08/30/2019 at 5:19 PM to discuss the potential use of an new treatment for mild to moderate COVID-19 viral infection in non-hospitalized patients.  This patient is a 68 y.o. male that meets the FDA criteria for Emergency Use Authorization of bamlanivimab/etesevimab or casirivimab/imdevimab.  Has a (+) direct SARS-CoV-2 viral test result  Has mild or moderate COVID-19   Is ? 71 years of age and weighs ? 40 kg  Is NOT hospitalized due to COVID-19  Is NOT requiring oxygen therapy or requiring an increase in baseline oxygen flow rate due to COVID-19  Is within 10 days of symptom onset  Has at least one of the high risk factor(s) for progression to severe COVID-19 and/or hospitalization as defined in EUA.  Specific high risk criteria : >/= 71 yo   I have spoken and communicated the following to the patient or parent/caregiver:  1. FDA has authorized the emergency use of bamlanivimab/etesevimab and casirivimab\imdevimab for the treatment of mild to moderate COVID-19 in adults and pediatric patients with positive results of direct SARS-CoV-2 viral testing who are 36 years of age and older weighing at least 40 kg, and who are at high risk for progressing to severe COVID-19 and/or hospitalization.  2. The significant known and potential risks and benefits of bamlanivimab/etesevimab and casirivimab\imdevimab, and the extent to which such potential risks and benefits are unknown.  3. Information on available alternative treatments and the risks and benefits of those alternatives, including clinical trials.  4. Patients treated with bamlanivimab/etesevimab and casirivimab\imdevimab should continue to self-isolate and use infection control measures (e.g., wear mask, isolate, social distance, avoid sharing personal items, clean and disinfect "high touch" surfaces, and frequent handwashing) according to CDC guidelines.   5. The patient or  parent/caregiver has the option to accept or refuse bamlanivimab/etesevimab or casirivimab\imdevimab .  After reviewing this information with the patient, The patient agreed to proceed with receiving the bamlanimivab infusion and will be provided a copy of the Fact sheet prior to receiving the infusion.  Scheduled 08/31/19 at 1230.  Lynelle Smoke Detra Bores 08/30/2019 5:19 PM

## 2019-08-30 NOTE — Telephone Encounter (Signed)
Called to discuss with patient about Covid symptoms and the use of bamlanivimab, a monoclonal antibody infusion for those with mild to moderate Covid symptoms and at a high risk of hospitalization.  Pt is qualified for this infusion at the [facility] due to Age > 65   Message left to call back  

## 2019-08-30 NOTE — ED Provider Notes (Signed)
[facility] DEPT Provider Note   CSN: [id number] Arrival date & time: 08/30/19  0957     History Chief Complaint  Patient presents with  . Fatigue  . Cough    Miguel Boone is a 69 y.o. male.  Patient states he was exposed to people that had Covid recently.  Patient complains of fever aches cough weakness  The history is provided by the patient. No language interpreter was used.  Cough Cough characteristics:  Non-productive Sputum characteristics:  Nondescript Severity:  Moderate Onset quality:  Sudden Timing:  Constant Progression:  Waxing and waning Chronicity:  New Smoker: no   Relieved by:  Nothing Worsened by:  Nothing Ineffective treatments:  None tried Associated symptoms: no chest pain, no eye discharge, no headaches and no rash        History reviewed. No pertinent past medical history.  There are no problems to display for this patient.   History reviewed. No pertinent surgical history.     No family history on file.  Social History   Tobacco Use  . Smoking status: Never Smoker  Substance Use Topics  . Alcohol use: No  . Drug use: No    Home Medications Prior to Admission medications   Medication Sig Start Date End Date Taking? Authorizing Provider  benzonatate (TESSALON) 100 MG capsule Take 1 capsule (100 mg total) by mouth every 8 (eight) hours. Patient not taking: Reported on 08/30/2019 09/22/15   Dowless, Dondra Spry, PA-C  Cetirizine HCl 10 MG CAPS Take 1 capsule (10 mg total) by mouth daily. Patient not taking: Reported on 08/30/2019 09/22/15   Dowless, Aldona Bar Tripp, PA-C  clotrimazole (LOTRIMIN) 1 % external solution Apply 1 application topically 2 (two) times daily. Patient not taking: Reported on 08/30/2019 09/22/15   Dowless, Aldona Bar Tripp, PA-C  ofloxacin (FLOXIN) 0.3 % otic solution Place 10 drops into both ears daily. Patient not taking: Reported on 08/30/2019 05/26/13   Muthersbaugh, Jarrett Soho, PA-C     Allergies    Patient has no known allergies.  Review of Systems   Review of Systems  Constitutional: Negative for appetite change and fatigue.  HENT: Negative for congestion, ear discharge and sinus pressure.   Eyes: Negative for discharge.  Respiratory: Positive for cough.   Cardiovascular: Negative for chest pain.  Gastrointestinal: Negative for abdominal pain and diarrhea.  Genitourinary: Negative for frequency and hematuria.  Musculoskeletal: Negative for back pain.  Skin: Negative for rash.  Neurological: Negative for seizures and headaches.  Psychiatric/Behavioral: Negative for hallucinations.    Physical Exam Updated Vital Signs BP (!) 159/90   Pulse 77   Temp 99.2 F (37.3 C) (Oral)   Resp 15   SpO2 92%   Physical Exam Vitals and nursing note reviewed.  Constitutional:      Appearance: He is well-developed.  HENT:     Head: Normocephalic.  Eyes:     General: No scleral icterus.    Conjunctiva/sclera: Conjunctivae normal.  Neck:     Thyroid: No thyromegaly.  Cardiovascular:     Rate and Rhythm: Normal rate and regular rhythm.     Heart sounds: No murmur. No friction rub. No gallop.   Pulmonary:     Breath sounds: No stridor. No wheezing or rales.  Chest:     Chest wall: No tenderness.  Abdominal:     General: There is no distension.     Tenderness: There is no abdominal tenderness. There is no rebound.  Musculoskeletal:  General: Normal range of motion.     Cervical back: Neck supple.  Lymphadenopathy:     Cervical: No cervical adenopathy.  Skin:    Findings: No erythema or rash.  Neurological:     Mental Status: He is alert and oriented to person, place, and time.     Motor: No abnormal muscle tone.     Coordination: Coordination normal.  Psychiatric:        Behavior: Behavior normal.     ED Results / Procedures / Treatments   Labs (all labs ordered are listed, but only abnormal results are displayed) Labs Reviewed  RESPIRATORY  PANEL BY RT PCR (FLU A&B, COVID) - Abnormal; Notable for the following components:      Result Value   SARS Coronavirus 2 by RT PCR POSITIVE (*)    All other components within normal limits  CBC WITH DIFFERENTIAL/PLATELET - Abnormal; Notable for the following components:   WBC 3.7 (*)    All other components within normal limits  COMPREHENSIVE METABOLIC PANEL - Abnormal; Notable for the following components:   Glucose, Bld 163 (*)    All other components within normal limits    EKG None  Radiology DG Chest 2 View  Result Date: 08/30/2019 CLINICAL DATA:  Cough EXAM: CHEST - 2 VIEW COMPARISON:  Sep 22, 2015. FINDINGS: Lungs are clear. Heart size and pulmonary vascularity are normal. No adenopathy. There is degenerative change in the thoracic spine. IMPRESSION: Lungs clear.  Cardiac silhouette within normal limits. Electronically Signed   By: Lowella Grip III M.D.   On: 08/30/2019 10:55    Procedures Procedures (including critical care time)  Medications Ordered in ED Medications - No data to display  ED Course  I have reviewed the triage vital signs and the nursing notes.  Pertinent labs & imaging results that were available during my care of the patient were reviewed by me and considered in my medical decision making (see chart for details).    Miguel Boone was evaluated in Emergency Department on 08/30/2019 for the symptoms described in the history of present illness. He was evaluated in the context of the global COVID-19 pandemic, which necessitated consideration that the patient might be at risk for infection with the SARS-CoV-2 virus that causes COVID-19. Institutional protocols and algorithms that pertain to the evaluation of patients at risk for COVID-19 are in a state of rapid change based on information released by regulatory bodies including the CDC and federal and state organizations. These policies and algorithms were followed during the patient's care in the  ED.  MDM Rules/Calculators/A&P                     Patient with recent Covid infection.  Patient does not need admission.  His name was given to [facility] for possible antibiotic treatment as an outpatient.  He will follow up with his PCP\     This patient presents to the ED for concern of cough, this involves an extensive number of treatment options, and is a complaint that carries with it a high risk of complications and morbidity.  The differential diagnosis includes pneumonia Covid   Lab Tests:   I Ordered, reviewed, and interpreted labs, which included Covid test which was positive  Medicines ordered:     Imaging Studies ordered:   I ordered imaging studies which included chest x-ray and  I independently visualized and interpreted imaging which showed unremarkable  Additional history obtained:   Additional  history obtained from records  Previous records obtained and reviewed  Consultations Obtained:   Reevaluation:  After the interventions stated above, I reevaluated the patient and found unchanged  Critical Interventions:  .   Final Clinical Impression(s) / ED Diagnoses Final diagnoses:  COVID-19    Rx / DC Orders ED Discharge Orders    None       Milton Ferguson, MD 08/30/19 1625

## 2019-08-30 NOTE — ED Triage Notes (Signed)
Pt reports that since yesterday been fatigued and coughing up phlegm that is white. Reports was around his friend 2 weeks ago who found out had Covid a week later.

## 2019-08-31 ENCOUNTER — Ambulatory Visit (HOSPITAL_COMMUNITY)
Admission: RE | Admit: 2019-08-31 | Discharge: 2019-08-31 | Disposition: A | Discharge status: Home / Self Care | Payer: Medicare Other | Source: Ambulatory Visit | Attending: Pulmonary Disease | Admitting: Pulmonary Disease

## 2019-08-31 DIAGNOSIS — U071 COVID-19: Secondary | ICD-10-CM | POA: Diagnosis present

## 2019-08-31 DIAGNOSIS — Z23 Encounter for immunization: Secondary | ICD-10-CM | POA: Diagnosis not present

## 2019-08-31 MED ORDER — METHYLPREDNISOLONE SODIUM SUCC 125 MG IJ SOLR: 125.0000 mg | Freq: Once | INTRAMUSCULAR | Status: DC | PRN

## 2019-08-31 MED ORDER — FAMOTIDINE IN NACL 20-0.9 MG/50ML-% IV SOLN: 20.0000 mg | Freq: Once | INTRAVENOUS | Status: DC | PRN

## 2019-08-31 MED ORDER — EPINEPHRINE 0.3 MG/0.3ML IJ SOAJ: 0.3000 mg | Freq: Once | INTRAMUSCULAR | Status: DC | PRN

## 2019-08-31 MED ORDER — ALBUTEROL SULFATE HFA 108 (90 BASE) MCG/ACT IN AERS: 2.0000 | INHALATION_SPRAY | Freq: Once | RESPIRATORY_TRACT | Status: DC | PRN

## 2019-08-31 MED ORDER — SODIUM CHLORIDE 0.9 % IV SOLN
Freq: Once | INTRAVENOUS | Status: AC
  Filled 2019-08-31: qty 700

## 2019-08-31 MED ORDER — DIPHENHYDRAMINE HCL 50 MG/ML IJ SOLN: 50.0000 mg | Freq: Once | INTRAMUSCULAR | Status: DC | PRN

## 2019-08-31 MED ORDER — SODIUM CHLORIDE 0.9 % IV SOLN: INTRAVENOUS | Status: DC | PRN

## 2019-08-31 NOTE — Discharge Instructions (Signed)
10 10 Things You Can Do to Manage Your COVID-19 Symptoms at Home If you have possible or confirmed COVID-19: 1. Stay home from work and school. And stay away from other public places. If you must go out, avoid using any kind of public transportation, ridesharing, or taxis. 2. Monitor your symptoms carefully. If your symptoms get worse, call your healthcare provider immediately. 3. Get rest and stay hydrated. 4. If you have a medical appointment, call the healthcare provider ahead of time and tell them that you have or may have COVID-19. 5. For medical emergencies, call 911 and notify the dispatch personnel that you have or may have COVID-19. 6. Cover your cough and sneezes with a tissue or use the inside of your elbow. 7. Wash your hands often with soap and water for at least 20 seconds or clean your hands with an alcohol-based hand sanitizer that contains at least 60% alcohol. 8. As much as possible, stay in a specific room and away from other people in your home. Also, you should use a separate bathroom, if available. If you need to be around other people in or outside of the home, wear a mask. 9. Avoid sharing personal items with other people in your household, like dishes, towels, and bedding. 10. Clean all surfaces that are touched often, like counters, tabletops, and doorknobs. Use household cleaning sprays or wipes according to the label instructions. [email or web address] 10/27/2018 This information is not intended to replace advice given to you by your health care provider. Make sure you discuss any questions you have with your health care provider. Document Revised: 03/31/2019 Document Reviewed: 03/31/2019 Elsevier Patient Education  [place]. What types of side effects do monoclonal antibody drugs cause?  Common side effects  In general, the more common side effects caused by monoclonal antibody drugs include: . Allergic reactions, such as hives or itching . Flu-like signs  and symptoms, including chills, fatigue, fever, and muscle aches and pains . Nausea, vomiting . Diarrhea . Skin rashes . Low blood pressure   The CDC is recommending patients who receive monoclonal antibody treatments wait at least 90 days before being vaccinated.  Currently, there are no data on the safety and efficacy of mRNA COVID-19 vaccines in persons who received monoclonal antibodies or convalescent plasma as part of COVID-19 treatment. Based on the estimated half-life of such therapies as well as evidence suggesting that reinfection is uncommon in the 90 days after initial infection, vaccination should be deferred for at least 90 days, as a precautionary measure until additional information becomes available, to avoid interference of the antibody treatment with vaccine-induced immune responses. What types of side effects do monoclonal antibody drugs cause?  Common side effects  In general, the more common side effects caused by monoclonal antibody drugs include: . Allergic reactions, such as hives or itching . Flu-like signs and symptoms, including chills, fatigue, fever, and muscle aches and pains . Nausea, vomiting . Diarrhea . Skin rashes . Low blood pressure   The CDC is recommending patients who receive monoclonal antibody treatments wait at least 90 days before being vaccinated.  Currently, there are no data on the safety and efficacy of mRNA COVID-19 vaccines in persons who received monoclonal antibodies or convalescent plasma as part of COVID-19 treatment. Based on the estimated half-life of such therapies as well as evidence suggesting that reinfection is uncommon in the 90 days after initial infection, vaccination should be deferred for at least 90 days, as a precautionary measure until additional  additional information becomes available, to avoid interference of the antibody treatment with vaccine-induced immune responses. 

## 2019-08-31 NOTE — Progress Notes (Signed)
  Diagnosis: COVID-19  Physician: Dr. Joya Gaskins  Procedure: Covid Infusion Clinic Med: bamlanivimab\etesevimab infusion - Provided patient with bamlanimivab\etesevimab fact sheet for patients, parents and caregivers prior to infusion.  Complications: No immediate complications noted.  Discharge: Discharged home   Acquanetta Chain 08/31/2019

## 2020-01-26 DIAGNOSIS — E118 Type 2 diabetes mellitus with unspecified complications: Secondary | ICD-10-CM | POA: Diagnosis not present

## 2020-01-26 DIAGNOSIS — E1122 Type 2 diabetes mellitus with diabetic chronic kidney disease: Secondary | ICD-10-CM | POA: Diagnosis not present

## 2020-01-26 DIAGNOSIS — E782 Mixed hyperlipidemia: Secondary | ICD-10-CM | POA: Diagnosis not present

## 2020-01-26 DIAGNOSIS — I10 Essential (primary) hypertension: Secondary | ICD-10-CM | POA: Diagnosis not present

## 2020-02-02 DIAGNOSIS — D573 Sickle-cell trait: Secondary | ICD-10-CM | POA: Diagnosis not present

## 2020-02-02 DIAGNOSIS — E782 Mixed hyperlipidemia: Secondary | ICD-10-CM | POA: Diagnosis not present

## 2020-02-02 DIAGNOSIS — E1122 Type 2 diabetes mellitus with diabetic chronic kidney disease: Secondary | ICD-10-CM | POA: Diagnosis not present

## 2020-02-02 DIAGNOSIS — E1165 Type 2 diabetes mellitus with hyperglycemia: Secondary | ICD-10-CM | POA: Diagnosis not present

## 2020-02-02 DIAGNOSIS — I129 Hypertensive chronic kidney disease with stage 1 through stage 4 chronic kidney disease, or unspecified chronic kidney disease: Secondary | ICD-10-CM | POA: Diagnosis not present

## 2020-02-02 DIAGNOSIS — E118 Type 2 diabetes mellitus with unspecified complications: Secondary | ICD-10-CM | POA: Diagnosis not present

## 2020-02-02 DIAGNOSIS — Z5320 Procedure and treatment not carried out because of patient's decision for unspecified reasons: Secondary | ICD-10-CM | POA: Diagnosis not present

## 2020-02-02 DIAGNOSIS — I10 Essential (primary) hypertension: Secondary | ICD-10-CM | POA: Diagnosis not present

## 2020-04-23 ENCOUNTER — Ambulatory Visit (HOSPITAL_COMMUNITY)
Admission: EM | Admit: 2020-04-23 | Discharge: 2020-04-23 | Disposition: A | Discharge status: Home / Self Care | Payer: Medicare HMO | Attending: Family Medicine | Admitting: Family Medicine

## 2020-04-23 ENCOUNTER — Other Ambulatory Visit: Payer: Self-pay

## 2020-04-23 ENCOUNTER — Observation Stay (HOSPITAL_COMMUNITY)
Admission: EM | Admit: 2020-04-23 | Discharge: 2020-04-26 | Disposition: A | Discharge status: Home / Self Care | Payer: Medicare HMO | Attending: Internal Medicine | Admitting: Internal Medicine

## 2020-04-23 ENCOUNTER — Encounter (HOSPITAL_COMMUNITY): Payer: Self-pay

## 2020-04-23 DIAGNOSIS — R112 Nausea with vomiting, unspecified: Secondary | ICD-10-CM

## 2020-04-23 DIAGNOSIS — R17 Unspecified jaundice: Secondary | ICD-10-CM

## 2020-04-23 DIAGNOSIS — R000 Tachycardia, unspecified: Secondary | ICD-10-CM | POA: Diagnosis not present

## 2020-04-23 DIAGNOSIS — N179 Acute kidney failure, unspecified: Secondary | ICD-10-CM | POA: Diagnosis not present

## 2020-04-23 DIAGNOSIS — E875 Hyperkalemia: Secondary | ICD-10-CM | POA: Diagnosis not present

## 2020-04-23 DIAGNOSIS — I10 Essential (primary) hypertension: Secondary | ICD-10-CM | POA: Diagnosis present

## 2020-04-23 DIAGNOSIS — Z79899 Other long term (current) drug therapy: Secondary | ICD-10-CM | POA: Diagnosis not present

## 2020-04-23 DIAGNOSIS — E860 Dehydration: Secondary | ICD-10-CM

## 2020-04-23 DIAGNOSIS — Z20822 Contact with and (suspected) exposure to covid-19: Secondary | ICD-10-CM | POA: Diagnosis not present

## 2020-04-23 DIAGNOSIS — R197 Diarrhea, unspecified: Secondary | ICD-10-CM | POA: Diagnosis present

## 2020-04-23 DIAGNOSIS — E785 Hyperlipidemia, unspecified: Secondary | ICD-10-CM | POA: Diagnosis present

## 2020-04-23 DIAGNOSIS — J029 Acute pharyngitis, unspecified: Secondary | ICD-10-CM | POA: Diagnosis not present

## 2020-04-23 DIAGNOSIS — E1110 Type 2 diabetes mellitus with ketoacidosis without coma: Secondary | ICD-10-CM | POA: Diagnosis not present

## 2020-04-23 DIAGNOSIS — E1011 Type 1 diabetes mellitus with ketoacidosis with coma: Secondary | ICD-10-CM

## 2020-04-23 HISTORY — DX: Proteinuria, unspecified: R80.9

## 2020-04-23 HISTORY — DX: Type 2 diabetes mellitus without complications: E11.9

## 2020-04-23 HISTORY — DX: Morbid (severe) obesity due to excess calories: E66.01

## 2020-04-23 HISTORY — DX: Sickle-cell trait: D57.3

## 2020-04-23 HISTORY — DX: Essential (primary) hypertension: I10

## 2020-04-23 HISTORY — DX: Mixed hyperlipidemia: E78.2

## 2020-04-23 HISTORY — DX: Benign prostatic hyperplasia without lower urinary tract symptoms: N40.0

## 2020-04-23 LAB — COMPREHENSIVE METABOLIC PANEL
ALT: 36 U/L (ref 0–44)
AST: 23 U/L (ref 15–41)
Albumin: 4.3 g/dL (ref 3.5–5.0)
Alkaline Phosphatase: 84 U/L (ref 38–126)
Anion gap: 22 — ABNORMAL HIGH (ref 5–15)
BUN: 29 mg/dL — ABNORMAL HIGH (ref 8–23)
CO2: 18 mmol/L — ABNORMAL LOW (ref 22–32)
Calcium: 10.4 mg/dL — ABNORMAL HIGH (ref 8.9–10.3)
Chloride: 95 mmol/L — ABNORMAL LOW (ref 98–111)
Creatinine, Ser: 1.57 mg/dL — ABNORMAL HIGH (ref 0.61–1.24)
GFR, Estimated: 47 mL/min — ABNORMAL LOW (ref >60)
Glucose, Bld: 686 mg/dL (ref 70–99)
Potassium: 5.5 mmol/L — ABNORMAL HIGH (ref 3.5–5.1)
Sodium: 135 mmol/L (ref 135–145)
Total Bilirubin: 1.7 mg/dL — ABNORMAL HIGH (ref 0.3–1.2)
Total Protein: 8.5 g/dL — ABNORMAL HIGH (ref 6.5–8.1)

## 2020-04-23 LAB — URINALYSIS, ROUTINE W REFLEX MICROSCOPIC
Bilirubin Urine: NEGATIVE
Glucose, UA: >=500 mg/dL — AB
Hgb urine dipstick: NEGATIVE
Ketones, ur: 20 mg/dL — AB
Leukocytes,Ua: NEGATIVE
Nitrite: NEGATIVE
Protein, ur: 100 mg/dL — AB
Specific Gravity, Urine: 1.029 (ref 1.005–1.030)
pH: 5 (ref 5.0–8.0)

## 2020-04-23 LAB — CBC
HCT: 52.1 % — ABNORMAL HIGH (ref 39.0–52.0)
Hemoglobin: 16.5 g/dL (ref 13.0–17.0)
MCH: 26 pg (ref 26.0–34.0)
MCHC: 31.7 g/dL (ref 30.0–36.0)
MCV: 82.2 fL (ref 80.0–100.0)
Platelets: 152 10*3/uL (ref 150–400)
RBC: 6.34 MIL/uL — ABNORMAL HIGH (ref 4.22–5.81)
RDW: 15.3 % (ref 11.5–15.5)
WBC: 5.7 10*3/uL (ref 4.0–10.5)
nRBC: 0 % (ref 0.0–0.2)

## 2020-04-23 LAB — RESP PANEL BY RT-PCR (FLU A&B, COVID) ARPGX2
Influenza A by PCR: NEGATIVE
Influenza B by PCR: NEGATIVE
SARS Coronavirus 2 by RT PCR: NEGATIVE

## 2020-04-23 LAB — LIPASE, BLOOD: Lipase: 25 U/L (ref 11–51)

## 2020-04-23 NOTE — ED Notes (Signed)
Patient is being discharged from the Urgent Care and sent to the Emergency Department via [facility]. Per Delton See MD, patient is in need of higher level of care due to Necessity for fluids and higher level of care. Patient is aware and verbalizes understanding of plan of care.  Vitals:   04/23/20 1732  BP: (!) 149/73  Pulse: (!) 109  Temp: 98.1 F (36.7 C)  SpO2: 97%

## 2020-04-23 NOTE — Discharge Instructions (Addendum)
You need to be seen in an emergency department for IV fluids and labs we cannot provide Go directly to ER

## 2020-04-23 NOTE — ED Provider Notes (Signed)
  BP (!) 149/73 (BP Location: Right Arm)   Pulse (!) 109   Temp 98.1 F (36.7 C) (Oral)   SpO2 97%   Patient states has been vomiting for 7 days straight.  He states he has not kept down any food..  Is feeling sluggish.  He is feeling very tired.  His doctor called in medicine for him.  This medicine has not helped.  As I enter the room he is sitting in the exam chair with his head slumped over on his shoulder.  He is slow to respond.  He states that he is not confused, difficulty understanding him at first.  He has obvious dry membranes and tenting of his skin  Patient states that he had Covid infection last night He is also had Covid vaccinations He has had no exposure to illness He denies any cough or respiratory symptoms, he states that his throat was sore just from the vomiting No abdominal Pain, no diarrhea  Because of his dehydration and slight sedation I am sending him to the emergency room for laboratory testing and iv fluid     Raylene Everts, MD 04/23/20 [phone]

## 2020-04-23 NOTE — ED Triage Notes (Signed)
Pt in with c/o ST and loss of appetite that has been going on for 1 week now. States that he hasn't been able to keep food or fluid down  States his provider sent him in prescriptions for his sxs but they haven't worked  Denies Nausea, diarrhea

## 2020-04-23 NOTE — ED Triage Notes (Signed)
Pt presents to ED POV. Pt c/o sore throat, n/v/d, loss of taste x1w. Pt sent from UC for labs and IVF. Pt fully vaccinated for covid.

## 2020-04-24 ENCOUNTER — Encounter (HOSPITAL_COMMUNITY): Payer: Self-pay | Admitting: Internal Medicine

## 2020-04-24 ENCOUNTER — Other Ambulatory Visit: Payer: Self-pay

## 2020-04-24 ENCOUNTER — Emergency Department (HOSPITAL_COMMUNITY): Payer: Medicare HMO

## 2020-04-24 DIAGNOSIS — E785 Hyperlipidemia, unspecified: Secondary | ICD-10-CM | POA: Diagnosis present

## 2020-04-24 DIAGNOSIS — E1110 Type 2 diabetes mellitus with ketoacidosis without coma: Secondary | ICD-10-CM | POA: Diagnosis not present

## 2020-04-24 DIAGNOSIS — E1011 Type 1 diabetes mellitus with ketoacidosis with coma: Secondary | ICD-10-CM | POA: Diagnosis not present

## 2020-04-24 DIAGNOSIS — E782 Mixed hyperlipidemia: Secondary | ICD-10-CM

## 2020-04-24 DIAGNOSIS — I10 Essential (primary) hypertension: Secondary | ICD-10-CM | POA: Diagnosis not present

## 2020-04-24 DIAGNOSIS — J029 Acute pharyngitis, unspecified: Secondary | ICD-10-CM | POA: Diagnosis not present

## 2020-04-24 DIAGNOSIS — N179 Acute kidney failure, unspecified: Secondary | ICD-10-CM | POA: Diagnosis present

## 2020-04-24 LAB — BASIC METABOLIC PANEL
Anion gap: 24 — ABNORMAL HIGH (ref 5–15)
Anion gap: 15 (ref 5–15)
Anion gap: 14 (ref 5–15)
BUN: 38 mg/dL — ABNORMAL HIGH (ref 8–23)
BUN: 38 mg/dL — ABNORMAL HIGH (ref 8–23)
BUN: 35 mg/dL — ABNORMAL HIGH (ref 8–23)
CO2: 16 mmol/L — ABNORMAL LOW (ref 22–32)
CO2: 25 mmol/L (ref 22–32)
CO2: 26 mmol/L (ref 22–32)
Calcium: 10 mg/dL (ref 8.9–10.3)
Calcium: 10 mg/dL (ref 8.9–10.3)
Calcium: 9.6 mg/dL (ref 8.9–10.3)
Chloride: 95 mmol/L — ABNORMAL LOW (ref 98–111)
Chloride: 103 mmol/L (ref 98–111)
Chloride: 105 mmol/L (ref 98–111)
Creatinine, Ser: 2.01 mg/dL — ABNORMAL HIGH (ref 0.61–1.24)
Creatinine, Ser: 1.69 mg/dL — ABNORMAL HIGH (ref 0.61–1.24)
Creatinine, Ser: 1.27 mg/dL — ABNORMAL HIGH (ref 0.61–1.24)
GFR, Estimated: 35 mL/min — ABNORMAL LOW (ref >60)
GFR, Estimated: 43 mL/min — ABNORMAL LOW (ref >60)
GFR, Estimated: >60 mL/min (ref >60)
Glucose, Bld: 763 mg/dL (ref 70–99)
Glucose, Bld: 297 mg/dL — ABNORMAL HIGH (ref 70–99)
Glucose, Bld: 227 mg/dL — ABNORMAL HIGH (ref 70–99)
Potassium: 6 mmol/L — ABNORMAL HIGH (ref 3.5–5.1)
Potassium: 4.1 mmol/L (ref 3.5–5.1)
Potassium: 4.1 mmol/L (ref 3.5–5.1)
Sodium: 135 mmol/L (ref 135–145)
Sodium: 143 mmol/L (ref 135–145)
Sodium: 145 mmol/L (ref 135–145)

## 2020-04-24 LAB — CBG MONITORING, ED
Glucose-Capillary: >600 mg/dL (ref 70–99)
Glucose-Capillary: >600 mg/dL (ref 70–99)
Glucose-Capillary: 551 mg/dL (ref 70–99)
Glucose-Capillary: >600 mg/dL (ref 70–99)
Glucose-Capillary: 555 mg/dL (ref 70–99)
Glucose-Capillary: 471 mg/dL — ABNORMAL HIGH (ref 70–99)
Glucose-Capillary: 445 mg/dL — ABNORMAL HIGH (ref 70–99)
Glucose-Capillary: 382 mg/dL — ABNORMAL HIGH (ref 70–99)
Glucose-Capillary: 408 mg/dL — ABNORMAL HIGH (ref 70–99)
Glucose-Capillary: 298 mg/dL — ABNORMAL HIGH (ref 70–99)
Glucose-Capillary: 267 mg/dL — ABNORMAL HIGH (ref 70–99)
Glucose-Capillary: 194 mg/dL — ABNORMAL HIGH (ref 70–99)

## 2020-04-24 LAB — I-STAT VENOUS BLOOD GAS, ED
Acid-base deficit: 3 mmol/L — ABNORMAL HIGH (ref 0.0–2.0)
Bicarbonate: 22.7 mmol/L (ref 20.0–28.0)
Calcium, Ion: 1.18 mmol/L (ref 1.15–1.40)
HCT: 50 % (ref 39.0–52.0)
Hemoglobin: 17 g/dL (ref 13.0–17.0)
O2 Saturation: 83 %
Potassium: 5.6 mmol/L — ABNORMAL HIGH (ref 3.5–5.1)
Sodium: 139 mmol/L (ref 135–145)
TCO2: 24 mmol/L (ref 22–32)
pCO2, Ven: 42.1 mmHg — ABNORMAL LOW (ref 44.0–60.0)
pH, Ven: 7.34 (ref 7.250–7.430)
pO2, Ven: 50 mmHg — ABNORMAL HIGH (ref 32.0–45.0)

## 2020-04-24 LAB — BASIC METABOLIC PANEL WITH GFR
Anion gap: 23 — ABNORMAL HIGH (ref 5–15)
Anion gap: 15 (ref 5–15)
BUN: 36 mg/dL — ABNORMAL HIGH (ref 8–23)
BUN: 35 mg/dL — ABNORMAL HIGH (ref 8–23)
CO2: 18 mmol/L — ABNORMAL LOW (ref 22–32)
CO2: 28 mmol/L (ref 22–32)
Calcium: 10 mg/dL (ref 8.9–10.3)
Calcium: 9.8 mg/dL (ref 8.9–10.3)
Chloride: 98 mmol/L (ref 98–111)
Chloride: 103 mmol/L (ref 98–111)
Creatinine, Ser: 1.84 mg/dL — ABNORMAL HIGH (ref 0.61–1.24)
Creatinine, Ser: 1.37 mg/dL — ABNORMAL HIGH (ref 0.61–1.24)
GFR, Estimated: 39 mL/min — ABNORMAL LOW
GFR, Estimated: 55 mL/min — ABNORMAL LOW
Glucose, Bld: 565 mg/dL (ref 70–99)
Glucose, Bld: 220 mg/dL — ABNORMAL HIGH (ref 70–99)
Potassium: 4.7 mmol/L (ref 3.5–5.1)
Potassium: 4 mmol/L (ref 3.5–5.1)
Sodium: 139 mmol/L (ref 135–145)
Sodium: 146 mmol/L — ABNORMAL HIGH (ref 135–145)

## 2020-04-24 LAB — BETA-HYDROXYBUTYRIC ACID
Beta-Hydroxybutyric Acid: 6.14 mmol/L — ABNORMAL HIGH (ref 0.05–0.27)
Beta-Hydroxybutyric Acid: 1.2 mmol/L — ABNORMAL HIGH (ref 0.05–0.27)
Beta-Hydroxybutyric Acid: 0.39 mmol/L — ABNORMAL HIGH (ref 0.05–0.27)

## 2020-04-24 LAB — GLUCOSE, CAPILLARY
Glucose-Capillary: 192 mg/dL — ABNORMAL HIGH (ref 70–99)
Glucose-Capillary: 200 mg/dL — ABNORMAL HIGH (ref 70–99)
Glucose-Capillary: 205 mg/dL — ABNORMAL HIGH (ref 70–99)
Glucose-Capillary: 206 mg/dL — ABNORMAL HIGH (ref 70–99)
Glucose-Capillary: 208 mg/dL — ABNORMAL HIGH (ref 70–99)
Glucose-Capillary: 225 mg/dL — ABNORMAL HIGH (ref 70–99)
Glucose-Capillary: 228 mg/dL — ABNORMAL HIGH (ref 70–99)
Glucose-Capillary: 230 mg/dL — ABNORMAL HIGH (ref 70–99)
Glucose-Capillary: 256 mg/dL — ABNORMAL HIGH (ref 70–99)

## 2020-04-24 LAB — HEMOGLOBIN A1C
Hgb A1c MFr Bld: 14.3 % — ABNORMAL HIGH (ref 4.8–5.6)
Mean Plasma Glucose: 363.71 mg/dL

## 2020-04-24 LAB — PHOSPHORUS: Phosphorus: 7.9 mg/dL — ABNORMAL HIGH (ref 2.5–4.6)

## 2020-04-24 LAB — MAGNESIUM: Magnesium: 2.5 mg/dL — ABNORMAL HIGH (ref 1.7–2.4)

## 2020-04-24 MED ORDER — INSULIN REGULAR(HUMAN) IN NACL 100-0.9 UT/100ML-% IV SOLN
INTRAVENOUS | Status: DC
  Administered 2020-04-24: 13 [IU]/h via INTRAVENOUS
  Administered 2020-04-24: 19 [IU]/h via INTRAVENOUS
  Administered 2020-04-24: 9.5 [IU]/h via INTRAVENOUS
  Administered 2020-04-25: 6 [IU]/h via INTRAVENOUS
  Filled 2020-04-24 (×4): qty 100

## 2020-04-24 MED ORDER — DEXTROSE 50 % IV SOLN: 0.0000 mL | INTRAVENOUS | Status: DC | PRN

## 2020-04-24 MED ORDER — DEXTROSE IN LACTATED RINGERS 5 % IV SOLN: INTRAVENOUS | Status: DC

## 2020-04-24 MED ORDER — LACTATED RINGERS IV SOLN: INTRAVENOUS | Status: DC

## 2020-04-24 MED ORDER — LIVING WELL WITH DIABETES BOOK
Freq: Once | Status: AC
  Filled 2020-04-24: qty 1

## 2020-04-24 MED ORDER — LACTATED RINGERS IV BOLUS
1000.0000 mL | Freq: Once | INTRAVENOUS | Status: AC
  Administered 2020-04-24: 1000 mL via INTRAVENOUS

## 2020-04-24 MED ORDER — ENOXAPARIN SODIUM 80 MG/0.8ML ~~LOC~~ SOLN
75.0000 mg | SUBCUTANEOUS | Status: DC
  Administered 2020-04-24 – 2020-04-25 (×2): 75 mg via SUBCUTANEOUS
  Filled 2020-04-24: qty 0.8
  Filled 2020-04-24: qty 0.75

## 2020-04-24 MED ORDER — ONDANSETRON HCL 4 MG/2ML IJ SOLN
4.0000 mg | Freq: Once | INTRAMUSCULAR | Status: AC
  Administered 2020-04-24: 4 mg via INTRAVENOUS
  Filled 2020-04-24: qty 2

## 2020-04-24 NOTE — Patient Outreach (Signed)
[facility]) Care Management  04/24/2020  Liban Guedes Crean [birth year] [id number]   Referral Date: 04-23-20 Referral Source:Hospital Referral Reason: New to insulin A1c 14.3   Referral received. Patient currently being admitted to the hospital.  Will follow hospitalization and will outreach after discharge from hospital as appropriate.  Bary Leriche, RN, MSN [facility] Care Management Care Management Coordinator Direct Line [phone] Toll Free: [phone]  Fax: [phone]

## 2020-04-24 NOTE — ED Notes (Signed)
Attempted report x1. 

## 2020-04-24 NOTE — Progress Notes (Signed)
PROGRESS NOTE    Miguel Boone  OIN:[id number] DOB: May 16, 1950 DOA: 04/23/2020 PCP: Georgianne Fick, MD   Brief Narrative:  Patient is 69 year old male with past medical history of type 2 diabetes mellitus, hypertension, hyperlipidemia, morbid obesity, microalbuminemia presents to emergency department with dry mouth, nausea, vomiting and diarrhea and sore throat.  Despite his PCP telling patient that he was diabetic for 10 years it sounds like patient declined to take medications. Patient had Covid earlier this year," fully vaccinated this fall. No fever or chills.  Upon arrival to ED: Patient blood glucose noted to be 636, anion gap: 22, mild AKI, creatinine 1.57, potassium 6.3, beta hydroxybutyric acid: 6.14, patient tachycardic, tachypneic, A1c: 14.3. Patient was found to be in DKA and was started on IV insulin GTT and admitted for further evaluation and management.  Assessment & Plan:  DKA: -In the setting of untreated type 2 diabetes mellitus. A1c 14.6%. -On admission: Blood glucose: 636, anion gap 22, beta hydroxybutyric acid: 6.14 twelve 7.3, bicarb: 18 -COVID-19 negative, UA negative for infection, chest x-ray negative for pneumonia. He is afebrile  -patient started on insulin gtt. in ED -BMP every 4 hour. -Gap is closed now. Bicarb: WNL, beta hydroxybutyric acid trended down to 1.20. -Blood glucose: Improving. -Patient insulin drip rates exceeding 12 units/h therefore continue with IV insulin.  -Appreciate diabetes coordinator's help.  AKI: -Likely secondary to dehydration in the setting of DKA. -Renal functions improving. Continue IV fluids.  Hyperkalemia: -In the setting of DKA-Resolved  Morbid Obesity: -BMI: 43 -Diet modification, exercise & weight loss recommended  DVT prophylaxis: SCD/Lovenox  Code Status: Full code  Family Communication:  None present at bedside.  Plan of care discussed with patient in length and he verbalized understanding and agreed  with it. Disposition Plan: Likely home tomorrow  Consultants:   none  Procedures:   none  Antimicrobials:   none   Status is: Inpatient   Dispo: The patient is from: Home              Anticipated d/c is to: Home              Anticipated d/c date is: 1 day              Patient currently is not medically stable to d/c.   Subjective: Patient seen and examined.  Resting comfortably on the bed.  Requesting for some ice chips.  He denies chest pain, shortness of breath, nausea, vomiting, abdominal pain, urinary or bowel changes.  Objective: Vitals:   04/24/20 0757 04/24/20 1030 04/24/20 1215 04/24/20 1403  BP: 120/78 (!) 158/94 113/77 139/72  Pulse: 98 92 90 86  Resp: 18 16 16 17   Temp: 97.9 F (36.6 C)   98 F (36.7 C)  TempSrc: Oral   Oral  SpO2: 97% 97% 95% 96%  Weight:      Height:       No intake or output data in the 24 hours ending 04/24/20 1423 Filed Weights   04/24/20 0400  Weight: (!) 151 kg    Examination:  General exam: Appears calm and comfortable, on RA, obese, appears dehydrated Respiratory system: Clear to auscultation. Respiratory effort normal. Cardiovascular system: S1 & S2 heard, RRR. No JVD, murmurs, rubs, gallops or clicks. No pedal edema. Gastrointestinal system: Abdomen is nondistended, soft and nontender. No organomegaly or masses felt. Normal bowel sounds heard. Central nervous system: Alert and oriented. No focal neurological deficits. Extremities: Symmetric 5 x 5 power. Skin: No rashes,  lesions or ulcers Psychiatry: Judgement and insight appear normal. Mood & affect appropriate.    Data Reviewed: I have personally reviewed following labs and imaging studies  CBC: Recent Labs  Lab 04/23/20 2004 04/24/20 0545  WBC 5.7  --   HGB 16.5 17.0  HCT 52.1* 50.0  MCV 82.2  --   PLT 152  --    Basic Metabolic Panel: Recent Labs  Lab 04/23/20 2004 04/24/20 0346 04/24/20 0545 04/24/20 0702 04/24/20 1138  NA 135 135 139 139 143   K 5.5* 6.0* 5.6* 4.7 4.1  CL 95* 95*  --  98 103  CO2 18* 16*  --  18* 25  GLUCOSE 686* 763*  --  565* 297*  BUN 29* 38*  --  36* 38*  CREATININE 1.57* 2.01*  --  1.84* 1.69*  CALCIUM 10.4* 10.0  --  10.0 10.0  MG  --  2.5*  --   --   --   PHOS  --  7.9*  --   --   --    GFR: Estimated Creatinine Clearance: 61.4 mL/min (A) (by C-G formula based on SCr of 1.69 mg/dL (H)). Liver Function Tests: Recent Labs  Lab 04/23/20 2004  AST 23  ALT 36  ALKPHOS 84  BILITOT 1.7*  PROT 8.5*  ALBUMIN 4.3   Recent Labs  Lab 04/23/20 2004  LIPASE 25   No results for input(s): AMMONIA in the last 168 hours. Coagulation Profile: No results for input(s): INR, PROTIME in the last 168 hours. Cardiac Enzymes: No results for input(s): CKTOTAL, CKMB, CKMBINDEX, TROPONINI in the last 168 hours. BNP (last 3 results) No results for input(s): PROBNP in the last 8760 hours. HbA1C: Recent Labs    04/24/20 0346  HGBA1C 14.3*   CBG: Recent Labs  Lab 04/24/20 0937 04/24/20 1010 04/24/20 1129 04/24/20 1235 04/24/20 1349  GLUCAP 408* 382* 298* 267* 194*   Lipid Profile: No results for input(s): CHOL, HDL, LDLCALC, TRIG, CHOLHDL, LDLDIRECT in the last 72 hours. Thyroid Function Tests: No results for input(s): TSH, T4TOTAL, FREET4, T3FREE, THYROIDAB in the last 72 hours. Anemia Panel: No results for input(s): VITAMINB12, FOLATE, FERRITIN, TIBC, IRON, RETICCTPCT in the last 72 hours. Sepsis Labs: No results for input(s): PROCALCITON, LATICACIDVEN in the last 168 hours.  Recent Results (from the past 240 hour(s))  Resp Panel by RT-PCR (Flu A&B, Covid) Nasopharyngeal Swab     Status: None   Collection Time: 04/23/20  7:10 PM   Specimen: Nasopharyngeal Swab; Nasopharyngeal(NP) swabs in vial transport medium  Result Value Ref Range Status   SARS Coronavirus 2 by RT PCR NEGATIVE NEGATIVE Final    Comment: (NOTE) SARS-CoV-2 target nucleic acids are NOT DETECTED.  The SARS-CoV-2 RNA is  generally detectable in upper respiratory specimens during the acute phase of infection. The lowest concentration of SARS-CoV-2 viral copies this assay can detect is 138 copies/mL. A negative result does not preclude SARS-Cov-2 infection and should not be used as the sole basis for treatment or other patient management decisions. A negative result may occur with  improper specimen collection/handling, submission of specimen other than nasopharyngeal swab, presence of viral mutation(s) within the areas targeted by this assay, and inadequate number of viral copies(<138 copies/mL). A negative result must be combined with clinical observations, patient history, and epidemiological information. The expected result is Negative.  Fact Sheet for Patients:  EntrepreneurPulse.com.au  Fact Sheet for Healthcare Providers:  IncredibleEmployment.be  This test is no t yet approved or cleared  by the [place] and  has been authorized for detection and/or diagnosis of SARS-CoV-2 by FDA under an Emergency Use Authorization (EUA). This EUA will remain  in effect (meaning this test can be used) for the duration of the COVID-19 declaration under Section 564(b)(1) of the Act, 21 U.S.C.section 360bbb-3(b)(1), unless the authorization is terminated  or revoked sooner.       Influenza A by PCR NEGATIVE NEGATIVE Final   Influenza B by PCR NEGATIVE NEGATIVE Final    Comment: (NOTE) The Xpert Xpress SARS-CoV-2/FLU/RSV plus assay is intended as an aid in the diagnosis of influenza from Nasopharyngeal swab specimens and should not be used as a sole basis for treatment. Nasal washings and aspirates are unacceptable for Xpert Xpress SARS-CoV-2/FLU/RSV testing.  Fact Sheet for Patients: EntrepreneurPulse.com.au  Fact Sheet for Healthcare Providers: IncredibleEmployment.be  This test is not yet approved or cleared by the [place] FDA and has been authorized for detection and/or diagnosis of SARS-CoV-2 by FDA under an Emergency Use Authorization (EUA). This EUA will remain in effect (meaning this test can be used) for the duration of the COVID-19 declaration under Section 564(b)(1) of the Act, 21 U.S.C. section 360bbb-3(b)(1), unless the authorization is terminated or revoked.  Performed at [facility], [place] [address] [place] [address]       Radiology Studies: DG Chest Port 1 View  Result Date: 04/24/2020 CLINICAL DATA:  Sore throat EXAM: PORTABLE CHEST 1 VIEW COMPARISON:  None. FINDINGS: The heart size and mediastinal contours are within normal limits. Mildly hazy airspace opacity seen at the right lung base. The visualized skeletal structures are unremarkable. IMPRESSION: Mildly hazy airspace opacity at the right lung base which may be due to atelectasis and/or infectious etiology. Electronically Signed   By: Prudencio Pair M.D.   On: 04/24/2020 03:53    Scheduled Meds: . enoxaparin (LOVENOX) injection  75 mg Subcutaneous Q24H   Continuous Infusions: . dextrose 5% lactated ringers 125 mL/hr at 04/24/20 1401  . insulin 6 Units/hr (04/24/20 1359)  . lactated ringers Stopped (04/24/20 1400)     LOS: 0 days   Time spent: 40 minutes   Ludia Gartland Loann Quill, MD [facility]  If 7PM-7AM, please contact night-coverage [email or web address] 04/24/2020, 2:23 PM

## 2020-04-24 NOTE — Progress Notes (Signed)
Inpatient Diabetes Program Recommendations  AACE/ADA: New Consensus Statement on Inpatient Glycemic Control (2015)  Target Ranges:  Prepandial:   less than 140 mg/dL      Peak postprandial:   less than 180 mg/dL (1-2 hours)      Critically ill patients:  140 - 180 mg/dL   Lab Results  Component Value Date   GLUCAP 194 (H) 04/24/2020   HGBA1C 14.3 (H) 04/24/2020    Review of Glycemic Control Results for ROYSTON, BEKELE (MRN [id number]) as of 04/24/2020 14:41  Ref. Range 04/24/2020 11:29 04/24/2020 12:35 04/24/2020 13:49  Glucose-Capillary Latest Ref Range: 70 - 99 mg/dL 298 (H) 267 (H) 194 (H)   Diabetes history: Type 2 DM Outpatient Diabetes medications: none Current orders for Inpatient glycemic control: IV insulin  Inpatient Diabetes Program Recommendations:     Patient insulin drip rates exceeding 12 units/hr, would continue with IV insulin.  Spoke with patient and support person regarding diabetes management. Patient has experienced polyuria and polydipsia. Has never been on insulin outpatient; patient is willing to learn. Reviewed patient's current A1c of 14.3%. Explained what a A1c is and what it measures. Also reviewed goal A1c with patient, importance of good glucose control @ home, and blood sugar goals. Reviewed patho of DM, need for insulin, role of pancreas, DKA, hyperglycemia, interventions, vascular changes and commorbidities.  Patient will need a glucose meter at discharge. Blood glucose meter kit (includes lancets and strips) (#[id number]). Discussed frequency of checking and when to call MD.  Will order [facility], [facility] and dietitian consults.  Will plan to see patient again on 12/29 to review insulin pen injections. Care instruction to begin when appropriate to allow patient to self inject. Patient has no questions at this time.    Thanks, Bronson Curb, MSN, RNC-OB Diabetes Coordinator [phone] (8a-5p)

## 2020-04-24 NOTE — ED Provider Notes (Signed)
[place] EMERGENCY DEPARTMENT Provider Note   CSN: [id number] Arrival date & time: 04/23/20  1841   History Chief Complaint  Patient presents with  . Sore Throat  . Diarrhea    Miguel Boone is a 69 y.o. male.  The history is provided by the patient.  Sore Throat  Diarrhea He has no significant past history and comes in with 1 week history of vomiting and unable to taste food.  He denies fever chills denies cough.  He denies any diarrhea.  He is feeling generally weak.  He went to urgent care and was referred here.  He denies any sick contacts.  He has been vaccinated against COVID-19.  No past medical history on file.  There are no problems to display for this patient.   No past surgical history on file.     No family history on file.  Social History   Tobacco Use  . Smoking status: Never Smoker  . Smokeless tobacco: Never Used  Substance Use Topics  . Alcohol use: No  . Drug use: No    Home Medications Prior to Admission medications   Medication Sig Start Date End Date Taking? Authorizing Provider  benzonatate (TESSALON) 100 MG capsule Take 1 capsule (100 mg total) by mouth every 8 (eight) hours. Patient not taking: Reported on 08/30/2019 09/22/15   Dowless, Dondra Spry, PA-C  Cetirizine HCl 10 MG CAPS Take 1 capsule (10 mg total) by mouth daily. Patient not taking: Reported on 08/30/2019 09/22/15   Dowless, Aldona Bar Tripp, PA-C  clotrimazole (LOTRIMIN) 1 % external solution Apply 1 application topically 2 (two) times daily. Patient not taking: Reported on 08/30/2019 09/22/15   Dowless, Aldona Bar Tripp, PA-C  ofloxacin (FLOXIN) 0.3 % otic solution Place 10 drops into both ears daily. Patient not taking: Reported on 08/30/2019 05/26/13   Muthersbaugh, Jarrett Soho, PA-C    Allergies    Patient has no known allergies.  Review of Systems   Review of Systems  Gastrointestinal: Positive for diarrhea.  All other systems reviewed and are  negative.   Physical Exam Updated Vital Signs BP 93/72   Pulse (!) 117   Temp 98.4 F (36.9 C) (Oral)   Resp 16   SpO2 99%   Physical Exam Vitals and nursing note reviewed.   69 year old male, resting comfortably and in no acute distress. Vital signs are significant for rapid heart rate and low blood pressure. Oxygen saturation is 99%, which is normal. Head is normocephalic and atraumatic. PERRLA, EOMI. mucous membranes are dry. Neck is nontender and supple without adenopathy or JVD. Back is nontender and there is no CVA tenderness. Lungs are clear without rales, wheezes, or rhonchi. Chest is nontender. Heart has regular rate and rhythm without murmur. Abdomen is soft, flat, nontender without masses or hepatosplenomegaly and peristalsis is hypoactive. Extremities have no cyanosis or edema, full range of motion is present. Skin is warm and dry without rash. Neurologic: Mental status is normal, cranial nerves are intact, there are no motor or sensory deficits.  ED Results / Procedures / Treatments   Labs (all labs ordered are listed, but only abnormal results are displayed) Labs Reviewed  COMPREHENSIVE METABOLIC PANEL - Abnormal; Notable for the following components:      Result Value   Potassium 5.5 (*)    Chloride 95 (*)    CO2 18 (*)    Glucose, Bld 686 (*)    BUN 29 (*)    Creatinine, Ser 1.57 (*)  Calcium 10.4 (*)    Total Protein 8.5 (*)    Total Bilirubin 1.7 (*)    GFR, Estimated 47 (*)    Anion gap 22 (*)    All other components within normal limits  CBC - Abnormal; Notable for the following components:   RBC 6.34 (*)    HCT 52.1 (*)    All other components within normal limits  URINALYSIS, ROUTINE W REFLEX MICROSCOPIC - Abnormal; Notable for the following components:   Glucose, UA >=500 (*)    Ketones, ur 20 (*)    Protein, ur 100 (*)    Bacteria, UA RARE (*)    All other components within normal limits  RESP PANEL BY RT-PCR (FLU A&B, COVID) [id number]   LIPASE, BLOOD  BASIC METABOLIC PANEL  CBG MONITORING, ED   Radiology DG Chest Port 1 View  Result Date: 04/24/2020 CLINICAL DATA:  Sore throat EXAM: PORTABLE CHEST 1 VIEW COMPARISON:  None. FINDINGS: The heart size and mediastinal contours are within normal limits. Mildly hazy airspace opacity seen at the right lung base. The visualized skeletal structures are unremarkable. IMPRESSION: Mildly hazy airspace opacity at the right lung base which may be due to atelectasis and/or infectious etiology. Electronically Signed   By: Jonna Clark M.D.   On: 04/24/2020 03:53    Procedures Procedures  CRITICAL CARE Performed by: Dione Booze Total critical care time: 45 minutes Critical care time was exclusive of separately billable procedures and treating other patients. Critical care was necessary to treat or prevent imminent or life-threatening deterioration. Critical care was time spent personally by me on the following activities: development of treatment plan with patient and/or surrogate as well as nursing, discussions with consultants, evaluation of patient's response to treatment, examination of patient, obtaining history from patient or surrogate, ordering and performing treatments and interventions, ordering and review of laboratory studies, ordering and review of radiographic studies, pulse oximetry and re-evaluation of patient's condition.  Medications Ordered in ED Medications  insulin regular, human (MYXREDLIN) 100 units/ 100 mL infusion (has no administration in time range)  lactated ringers infusion (has no administration in time range)  dextrose 5 % in lactated ringers infusion (has no administration in time range)  dextrose 50 % solution 0-50 mL (has no administration in time range)  ondansetron (ZOFRAN) injection 4 mg (has no administration in time range)  lactated ringers bolus 1,000 mL (1,000 mLs Intravenous New Bag/Given 04/24/20 0329)    ED Course  I have reviewed the  triage vital signs and the nursing notes.  Pertinent labs & imaging results that were available during my care of the patient were reviewed by me and considered in my medical decision making (see chart for details).  MDM Rules/Calculators/A&P Nausea and vomiting with loss of taste.  This is certainly concerning for COVID-19.  However, respiratory pathogen panel was negative for COVID-19 as well as negative for influenza.  Labs show hyperglycemia with elevated anion gap consistent with ketoacidosis.  There has also been an increase in creatinine compared with baseline and elevation of bilirubin of uncertain cause.  Hemoglobin has increased compared with baseline, and likely secondary to dehydration.  He is started on IV fluids and insulin.  Additional screening labs are obtained.  Chest x-ray shows area of atelectasis.  Case is discussed with Dr. Julian Reil of [facility], who agreed to admit the patient.  Old records are reviewed showing no known history of diabetes although random glucose on 08/30/2019 was elevated at 163.  Final  Clinical Impression(s) / ED Diagnoses Final diagnoses:  Diabetic ketoacidosis with coma associated with type 1 diabetes mellitus ([place])  Acute kidney injury (nontraumatic) (HCC)  Serum total bilirubin elevated  Hyperkalemia    Rx / DC Orders ED Discharge Orders    None       Delora Fuel, MD [id number] [phone]

## 2020-04-24 NOTE — H&P (Signed)
History and Physical    Miguel Boone [id number] DOB: [birth year] DOA: 04/23/2020  PCP: Merrilee Seashore, MD  Patient coming from: Home  I have personally briefly reviewed patient's old medical records in [place]  Chief Complaint: N/V/D  HPI: Miguel Boone is a 69 y.o. male with medical history significant of DM2, HTN, HLD, morbid obesity, microalbuminuria.  Despite Dr. Ashby Dawes telling patient that he was diabetic "for 10 years" it sounds like pt declined to take chronic medications from the sounds of it.  Difficult to tell entirely though as I can only see the continuity of care documents from [facility].  Regardless, patient presents to ED today with 1 week h/o N/V/D, dry mouth, loss of taste, sore throat.  Symptoms are severe, constant.  Nothing makes better or worse.  Pt had COVID earlier this year, got fully vaccinated this fall.  No fevers, chills.   ED Course: Pt with DKA: BGL 686, AG 22. Mild AKI: Creat 1.57.  Started on IVF boluses and insulin gtt.   Review of Systems: As per HPI, otherwise all review of systems negative.  Past Medical History:  Diagnosis Date  . BPH without obstruction/lower urinary tract symptoms   . DM2 (diabetes mellitus, type 2) ([place])   . HTN (hypertension)   . Microalbuminuria   . Mixed hyperlipidemia   . Morbid obesity ([place])   . Sickle cell trait ([place])     History reviewed. No pertinent surgical history.   reports that he has never smoked. He has never used smokeless tobacco. He reports that he does not drink alcohol and does not use drugs.  No Known Allergies  Family History  Problem Relation Age of Onset  . Diabetes Father      Prior to Admission medications   Medication Sig Start Date End Date Taking? Authorizing Provider  Pseudoeph-Bromphen-DM (BROMFED DM PO) Take 5 mLs by mouth 3 (three) times daily as needed (cough). 04/17/20  Yes [provider]  azithromycin  (ZITHROMAX) 250 MG tablet Take 250 mg by mouth as directed. Patient not taking: No sig reported 04/18/20   [provider]    Physical Exam: Vitals:   04/23/20 2319 04/24/20 0150 04/24/20 0324 04/24/20 0400  BP: (!) 137/107 93/72    Pulse: (!) 117 (!) 117    Resp: 18 16    Temp:   98.3 F (36.8 C)   TempSrc:   Oral   SpO2: 97% 99%    Weight:    (!) 151 kg  Height:    6\' 1"  (1.854 m)    Constitutional: NAD, calm, comfortable Eyes: PERRL, lids and conjunctivae normal ENMT: Mucous membranes are moist. Posterior pharynx clear of any exudate or lesions.Normal dentition.  Neck: normal, supple, no masses, no thyromegaly Respiratory: clear to auscultation bilaterally, no wheezing, no crackles. Normal respiratory effort. No accessory muscle use.  Cardiovascular: Regular rate and rhythm, no murmurs / rubs / gallops. No extremity edema. 2+ pedal pulses. No carotid bruits.  Abdomen: no tenderness, no masses palpated. No hepatosplenomegaly. Bowel sounds positive.  Musculoskeletal: no clubbing / cyanosis. No joint deformity upper and lower extremities. Good ROM, no contractures. Normal muscle tone.  Skin: no rashes, lesions, ulcers. No induration Neurologic: CN 2-12 grossly intact. Sensation intact, DTR normal. Strength 5/5 in all 4.  Psychiatric: Normal judgment and insight. Alert and oriented x 3. Normal mood.    Labs on Admission: I have personally reviewed following labs and imaging studies  CBC: Recent Labs  Lab 04/23/20 2004  WBC 5.7  HGB 16.5  HCT 52.1*  MCV 82.2  PLT 152   Basic Metabolic Panel: Recent Labs  Lab 04/23/20 2004  NA 135  K 5.5*  CL 95*  CO2 18*  GLUCOSE 686*  BUN 29*  CREATININE 1.57*  CALCIUM 10.4*   GFR: Estimated Creatinine Clearance: 66.1 mL/min (A) (by C-G formula based on SCr of 1.57 mg/dL (H)). Liver Function Tests: Recent Labs  Lab 04/23/20 2004  AST 23  ALT 36  ALKPHOS 84  BILITOT 1.7*  PROT 8.5*  ALBUMIN 4.3   Recent Labs   Lab 04/23/20 2004  LIPASE 25   No results for input(s): AMMONIA in the last 168 hours. Coagulation Profile: No results for input(s): INR, PROTIME in the last 168 hours. Cardiac Enzymes: No results for input(s): CKTOTAL, CKMB, CKMBINDEX, TROPONINI in the last 168 hours. BNP (last 3 results) No results for input(s): PROBNP in the last 8760 hours. HbA1C: No results for input(s): HGBA1C in the last 72 hours. CBG: Recent Labs  Lab 04/24/20 0349  GLUCAP >600*   Lipid Profile: No results for input(s): CHOL, HDL, LDLCALC, TRIG, CHOLHDL, LDLDIRECT in the last 72 hours. Thyroid Function Tests: No results for input(s): TSH, T4TOTAL, FREET4, T3FREE, THYROIDAB in the last 72 hours. Anemia Panel: No results for input(s): VITAMINB12, FOLATE, FERRITIN, TIBC, IRON, RETICCTPCT in the last 72 hours. Urine analysis:    Component Value Date/Time   COLORURINE YELLOW 04/23/2020 1909   APPEARANCEUR CLEAR 04/23/2020 1909   LABSPEC 1.029 04/23/2020 1909   PHURINE 5.0 04/23/2020 1909   GLUCOSEU >=500 (A) 04/23/2020 1909   HGBUR NEGATIVE 04/23/2020 1909   BILIRUBINUR NEGATIVE 04/23/2020 1909   KETONESUR 20 (A) 04/23/2020 1909   PROTEINUR 100 (A) 04/23/2020 1909   NITRITE NEGATIVE 04/23/2020 1909   LEUKOCYTESUR NEGATIVE 04/23/2020 1909    Radiological Exams on Admission: DG Chest Port 1 View  Result Date: 04/24/2020 CLINICAL DATA:  Sore throat EXAM: PORTABLE CHEST 1 VIEW COMPARISON:  None. FINDINGS: The heart size and mediastinal contours are within normal limits. Mildly hazy airspace opacity seen at the right lung base. The visualized skeletal structures are unremarkable. IMPRESSION: Mildly hazy airspace opacity at the right lung base which may be due to atelectasis and/or infectious etiology. Electronically Signed   By: Jonna Clark M.D.   On: 04/24/2020 03:53    EKG: Independently reviewed.  Assessment/Plan Principal Problem:   DKA, type 2 (HCC) Active Problems:   AKI (acute kidney  injury) (HCC)   HTN (hypertension)   HLD (hyperlipidemia)    1. DKA type 2 - 1. Due to untreated DM2 most likely 2. DKA pathway 3. IVF 4. Insulin gtt 5. BMP Q4H 6. BHB pending 7. 20 keytones in urine 8. AG 22 initially 9. Did discuss with pt that he would need to be on long term "life long probably" medication treatment for DM 1. Defer to diabetes coordinator, day team / discharging doc, and PCP Dr. Nicholos Johns wether this is insulin vs PO meds 10. A1C pending, but also discussed with patient that A1C may be falsely low in patients with sickle cell trait (which he has). 2. AKI - 1. Dehydration due to #1 2. IVF 3. Serial BMPs 3. HTN - 1. Not on any chronic BP meds 2. BP running on low side today anyhow 3. Monitor for now 4. HLD - 1. Pt declined statin previously per PCP notes it looks like  DVT prophylaxis: Lovenox Code Status: Full Family Communication:  No family in room Disposition Plan: Home after DKA resolved Consults called: None Admission status: Admit to inpatient  Severity of Illness: The appropriate patient status for this patient is INPATIENT. Inpatient status is judged to be reasonable and necessary in order to provide the required intensity of service to ensure the patient's safety. The patient's presenting symptoms, physical exam findings, and initial radiographic and laboratory data in the context of their chronic comorbidities is felt to place them at high risk for further clinical deterioration. Furthermore, it is not anticipated that the patient will be medically stable for discharge from the hospital within 2 midnights of admission. The following factors support the patient status of inpatient.   IP status for treatment of DKA.   * I certify that at the point of admission it is my clinical judgment that the patient will require inpatient hospital care spanning beyond 2 midnights from the point of admission due to high intensity of service, high risk for  further deterioration and high frequency of surveillance required.*    Mozes Sagar M. DO [facility]  How to contact the [facility] Attending or Consulting provider [place] or covering provider during after hours [place], for this patient?  1. Check the care team in [facility] and look for a) attending/consulting [facility] provider listed and b) the [facility] team listed 2. Log into [email or web address]  Amion Physician Scheduling and messaging for groups and whole hospitals  On call and physician scheduling software for group practices, residents, hospitalists and other medical providers for call, clinic, rotation and shift schedules. OnCall [employer] is a hospital-wide system for scheduling doctors and paging doctors on call. EasyPlot is for scientific plotting and data analysis.  [email or web address]  and use [place]'s universal password to access. If you do not have the password, please contact the hospital operator.  3. Locate the [facility] provider you are looking for under [facility] and page to a number that you can be directly reached. 4. If you still have difficulty reaching the provider, please page the [facility] (Director on Call) for the Hospitalists listed on amion for assistance.  04/24/2020, 4:10 AM

## 2020-04-25 DIAGNOSIS — R17 Unspecified jaundice: Secondary | ICD-10-CM | POA: Diagnosis not present

## 2020-04-25 DIAGNOSIS — E1110 Type 2 diabetes mellitus with ketoacidosis without coma: Secondary | ICD-10-CM | POA: Diagnosis not present

## 2020-04-25 DIAGNOSIS — I10 Essential (primary) hypertension: Secondary | ICD-10-CM | POA: Diagnosis not present

## 2020-04-25 DIAGNOSIS — E875 Hyperkalemia: Secondary | ICD-10-CM | POA: Diagnosis not present

## 2020-04-25 DIAGNOSIS — E1011 Type 1 diabetes mellitus with ketoacidosis with coma: Secondary | ICD-10-CM | POA: Diagnosis not present

## 2020-04-25 DIAGNOSIS — N179 Acute kidney failure, unspecified: Secondary | ICD-10-CM | POA: Diagnosis not present

## 2020-04-25 DIAGNOSIS — E782 Mixed hyperlipidemia: Secondary | ICD-10-CM | POA: Diagnosis not present

## 2020-04-25 LAB — BASIC METABOLIC PANEL
Anion gap: 12 (ref 5–15)
Anion gap: 15 (ref 5–15)
BUN: 32 mg/dL — ABNORMAL HIGH (ref 8–23)
BUN: 36 mg/dL — ABNORMAL HIGH (ref 8–23)
CO2: 21 mmol/L — ABNORMAL LOW (ref 22–32)
CO2: 27 mmol/L (ref 22–32)
Calcium: 9.3 mg/dL (ref 8.9–10.3)
Calcium: 9.5 mg/dL (ref 8.9–10.3)
Chloride: 108 mmol/L (ref 98–111)
Chloride: 108 mmol/L (ref 98–111)
Creatinine, Ser: 1.19 mg/dL (ref 0.61–1.24)
Creatinine, Ser: 1.24 mg/dL (ref 0.61–1.24)
GFR, Estimated: >60 mL/min (ref >60)
GFR, Estimated: >60 mL/min (ref >60)
Glucose, Bld: 200 mg/dL — ABNORMAL HIGH (ref 70–99)
Glucose, Bld: 238 mg/dL — ABNORMAL HIGH (ref 70–99)
Potassium: 3.6 mmol/L (ref 3.5–5.1)
Potassium: 4.4 mmol/L (ref 3.5–5.1)
Sodium: 144 mmol/L (ref 135–145)
Sodium: 147 mmol/L — ABNORMAL HIGH (ref 135–145)

## 2020-04-25 LAB — GLUCOSE, CAPILLARY
Glucose-Capillary: 165 mg/dL — ABNORMAL HIGH (ref 70–99)
Glucose-Capillary: 165 mg/dL — ABNORMAL HIGH (ref 70–99)
Glucose-Capillary: 170 mg/dL — ABNORMAL HIGH (ref 70–99)
Glucose-Capillary: 173 mg/dL — ABNORMAL HIGH (ref 70–99)
Glucose-Capillary: 179 mg/dL — ABNORMAL HIGH (ref 70–99)
Glucose-Capillary: 183 mg/dL — ABNORMAL HIGH (ref 70–99)
Glucose-Capillary: 191 mg/dL — ABNORMAL HIGH (ref 70–99)
Glucose-Capillary: 210 mg/dL — ABNORMAL HIGH (ref 70–99)
Glucose-Capillary: 297 mg/dL — ABNORMAL HIGH (ref 70–99)
Glucose-Capillary: 299 mg/dL — ABNORMAL HIGH (ref 70–99)
Glucose-Capillary: 303 mg/dL — ABNORMAL HIGH (ref 70–99)

## 2020-04-25 LAB — CBC
HCT: 44.3 % (ref 39.0–52.0)
Hemoglobin: 14.6 g/dL (ref 13.0–17.0)
MCH: 26.4 pg (ref 26.0–34.0)
MCHC: 33 g/dL (ref 30.0–36.0)
MCV: 80 fL (ref 80.0–100.0)
Platelets: 118 10*3/uL — ABNORMAL LOW (ref 150–400)
RBC: 5.54 MIL/uL (ref 4.22–5.81)
RDW: 15 % (ref 11.5–15.5)
WBC: 5.9 10*3/uL (ref 4.0–10.5)
nRBC: 0 % (ref 0.0–0.2)

## 2020-04-25 LAB — MAGNESIUM: Magnesium: 2.3 mg/dL (ref 1.7–2.4)

## 2020-04-25 LAB — PROCALCITONIN: Procalcitonin: <0.1 ng/mL

## 2020-04-25 MED ORDER — INSULIN GLARGINE 100 UNIT/ML ~~LOC~~ SOLN
55.0000 [IU] | Freq: Every day | SUBCUTANEOUS | Status: DC
  Administered 2020-04-25: 55 [IU] via SUBCUTANEOUS
  Filled 2020-04-25: qty 0.55

## 2020-04-25 MED ORDER — INSULIN ASPART 100 UNIT/ML ~~LOC~~ SOLN
4.0000 [IU] | Freq: Three times a day (TID) | SUBCUTANEOUS | Status: DC
  Administered 2020-04-25 (×2): 4 [IU] via SUBCUTANEOUS

## 2020-04-25 MED ORDER — INSULIN GLARGINE 100 UNIT/ML ~~LOC~~ SOLN
45.0000 [IU] | Freq: Every day | SUBCUTANEOUS | Status: DC
  Filled 2020-04-25: qty 0.45

## 2020-04-25 MED ORDER — INSULIN ASPART 100 UNIT/ML ~~LOC~~ SOLN
0.0000 [IU] | Freq: Every day | SUBCUTANEOUS | Status: DC
  Administered 2020-04-25: 3 [IU] via SUBCUTANEOUS

## 2020-04-25 MED ORDER — INSULIN ASPART PROT & ASPART (70-30 MIX) 100 UNIT/ML ~~LOC~~ SUSP
55.0000 [IU] | Freq: Every day | SUBCUTANEOUS | Status: DC
  Administered 2020-04-26: 55 [IU] via SUBCUTANEOUS

## 2020-04-25 MED ORDER — INSULIN ASPART 100 UNIT/ML ~~LOC~~ SOLN
0.0000 [IU] | Freq: Three times a day (TID) | SUBCUTANEOUS | Status: DC
  Administered 2020-04-25: 15 [IU] via SUBCUTANEOUS
  Administered 2020-04-25: 11 [IU] via SUBCUTANEOUS
  Administered 2020-04-26: 4 [IU] via SUBCUTANEOUS
  Administered 2020-04-26: 11 [IU] via SUBCUTANEOUS

## 2020-04-25 MED ORDER — INSULIN ASPART PROT & ASPART (70-30 MIX) 100 UNIT/ML ~~LOC~~ SUSP
45.0000 [IU] | Freq: Every day | SUBCUTANEOUS | Status: DC
  Administered 2020-04-25: 45 [IU] via SUBCUTANEOUS
  Filled 2020-04-25: qty 10

## 2020-04-25 NOTE — Care Management CC44 (Signed)
Condition Code 44 Documentation Completed  Patient Details  Name: Miguel Boone MRN: [id number] Date of Birth: [birth year]   Condition Code 44 given:  Yes Patient signature on Condition Code 44 notice:  Yes Documentation of 2 MD's agreement:  Yes Code 44 added to claim:  Yes    Lawerance Sabal, RN 04/25/2020, 4:48 PM

## 2020-04-25 NOTE — Progress Notes (Signed)
PROGRESS NOTE  Miguel Boone [id number] DOB: 10/18/1950 DOA: 04/23/2020 PCP: Merrilee Seashore, MD  Brief History   Patient is 69 year old male with past medical history of type 2 diabetes mellitus, hypertension, hyperlipidemia, morbid obesity, microalbuminemia presents to emergency department with dry mouth, nausea, vomiting and diarrhea and sore throat.  Despite his PCP telling patient that he was diabetic for 10 years it sounds like patient declined to take medications. Patient had Covid earlier this year," fully vaccinated this fall. No fever or chills.  Upon arrival to ED: Patient blood glucose noted to be 636, anion gap: 22, mild AKI, creatinine 1.57, potassium 6.3, beta hydroxybutyric acid: 6.14, patient tachycardic, tachypneic, A1c: 14.3. Patient was found to be in DKA and was started on IV insulin GTT and admitted for further evaluation and management.  [facility] were consulted to admit the patient for further evaluation and treatment. The patient was placed on endotool. His anion gap has closed and his Beta hydroxybutyric acid has normalized. He was started on subcutaneous lantus with FSBS and SSI. His glucoses after this was initiated have been 200-297.   The patient has had diabetic teaching. He states that he now understands that he must take insulin.  Consultants  . None  Procedures  . None  Antibiotics   Anti-infectives (From admission, onward)   None    .  Subjective  The patient is resting comfortably. No new complaints.  Objective   Vitals:  Vitals:   04/25/20 0739 04/25/20 1046  BP: 126/71 133/63  Pulse: 77 95  Resp: 16 17  Temp: 98.2 F (36.8 C) 98 F (36.7 C)  SpO2: 93% 95%   Exam:  Constitutional:  . The patient is awake, alert, and oriented x 3. No acute distress. Respiratory:  . No increased work of breathing. . No wheezes, rales, or rhonchi . No tactile fremitus Cardiovascular:  . Regular rate and rhythm . No  murmurs, ectopy, or gallups. . No lateral PMI. No thrills. Abdomen:  . Abdomen is soft, non-tender, non-distended . No hernias, masses, or organomegaly . Normoactive bowel sounds.  Musculoskeletal:  . No cyanosis, clubbing, or edema Skin:  . No rashes, lesions, ulcers . palpation of skin: no induration or nodules Neurologic:  . CN 2-12 intact . Sensation all 4 extremities intact Psychiatric:  . Mental status o Mood, affect appropriate o Orientation to person, place, time  . judgment and insight appear intact  I have personally reviewed the following:   Today's Data  . Vitals, CBC, BMP  Imaging  . CXR  Scheduled Meds: . enoxaparin (LOVENOX) injection  75 mg Subcutaneous Q24H  . insulin aspart  0-20 Units Subcutaneous TID WC  . insulin aspart  0-5 Units Subcutaneous QHS  . insulin aspart  4 Units Subcutaneous TID WC  . insulin aspart protamine- aspart  45 Units Subcutaneous Q supper  . [START ON 04/26/2020] insulin aspart protamine- aspart  55 Units Subcutaneous Q breakfast   Continuous Infusions: . insulin Stopped (04/25/20 1045)  . lactated ringers Stopped (04/24/20 1400)    Principal Problem:   DKA, type 2 ([place]) Active Problems:   AKI (acute kidney injury) ([place])   HTN (hypertension)   HLD (hyperlipidemia)   LOS: 1 day   A & P  DKA:  In the setting of untreated type 2 diabetes mellitus. A1c 14.6%. On admission: Blood glucose: 636, anion gap 22, beta hydroxybutyric acid: 6.14 twelve 7.3, bicarb: 18. COVID-19 negative, UA negative for infection, chest x-ray negative for pneumonia.  He is afebrile. Endotool (IV insulin) initiated. Gap closed and beta hydroxybutyric acid have normalized. Pt has been converted to long acting subcutaneous insulin. The assistance of the diabetic coordinator is appreciated.  AKI: Likely secondary to dehydration in the setting of DKA. Renal functions improving. Continue IV fluids.  Hyperkalemia: In the setting of DKA-Resolved  Morbid  Obesity: BMI: 43. Diet modification, exercise & weight loss recommended.  Pneumonia: CXR demonstrated a mild hazy airspace opacity at the right lung base. Possibly due to an infectious etiology vs atelectasis. Pt without respiratory symptoms, but will check a procalcitonin.   I have seen and examined this patient myself. I have spent 35 minutes in his evaluation and care.  DVT prophylaxis: SCD/Lovenox        Code Status: Full code  Family Communication:  None present at bedside.  Plan of care discussed with patient in length and he verbalized understanding and agreed with it. Disposition Plan:  Status is: Observation  The patient remains OBS appropriate and will d/c before 2 midnights.  Dispo: The patient is from: Home              Anticipated d/c is to: Home              Anticipated d/c date is: 1 day              Patient currently is not medically stable to d/c. Quanisha Drewry, DO [facility] Direct contact: see [email or web address]  7PM-7AM contact night coverage as above 04/25/2020, 6:05 PM  LOS: 1 day

## 2020-04-25 NOTE — Progress Notes (Addendum)
Inpatient Diabetes Program Recommendations  AACE/ADA: New Consensus Statement on Inpatient Glycemic Control (2015)  Target Ranges:  Prepandial:   less than 140 mg/dL      Peak postprandial:   less than 180 mg/dL (1-2 hours)      Critically ill patients:  140 - 180 mg/dL   Lab Results  Component Value Date   GLUCAP 303 (H) 04/25/2020   HGBA1C 14.3 (H) 04/24/2020    Review of Glycemic Control Results for Miguel Boone, Miguel Boone (MRN [id number]) as of 04/25/2020 12:11  Ref. Range 04/25/2020 04:53 04/25/2020 05:54 04/25/2020 07:16 04/25/2020 08:26 04/25/2020 10:43  Glucose-Capillary Latest Ref Range: 70 - 99 mg/dL 165 (H) 165 (H) 173 (H) 179 (H) 303 (H)   Diabetes history:  DM2 Outpatient Diabetes medications:  None Current orders for Inpatient glycemic control:  Lantus 55 units Q10Am & 45 units QHS Novolog 0-20 units TID Novolog 4 units TID with meals  Inpatient Diabetes Program Recommendations:     Spoke with patient and brother at bedside.  Patient lives alone and brother lives about 25 mins away. They talk everyday on the phone.  Educated patient and brother on insulin pen use at home. Reviewed contents of insulin flexpen starter kit. Reviewed all steps of insulin pen including attachment of needle, 2-unit air shot, dialing up dose, giving injection, removing needle, disposal of sharps, storage of unused insulin, disposal of insulin etc. Patient able to provide successful return demonstration. Also reviewed troubleshooting with insulin pen. MD to give patient Rxs for insulin pens and insulin pen needles.  Brother returned demonstrated; patient attempted to return demonstrate with difficulty remembering steps.  Patient needs further education and follow up.  Will plan to see patient in the morning again.  After speaking with Miguel Boone,  I feel 70/30 will be a better option for him with less injections and simpler regimen.  Patient is scheduled to get 45 units of Lantus tonight.  Morning  CBG will help determine basal need and dosing for 70/30.  Please consider starting 70/30 tomorrow afternoon.  If planning to discharge tomorrow please consider holding 45 units of Lantus tonight and start 70/30 with supper.      Asked patient to check CBG's before breakfast and supper and again at bedtime.  If < 100 mg/dL at bedtime he should have a small snack.  Reinforced CHO teaching and DM education.    Will continue to follow while inpatient.  Thank you, Miguel Dixon, RN, BSN Diabetes Coordinator Inpatient Diabetes Program [phone] (team pager from 8a-5p)

## 2020-04-25 NOTE — Discharge Instructions (Signed)
Diabetes Mellitus and Sick Day Management Blood sugar (glucose) can be difficult to control when you are sick. Common illnesses that can cause problems for people with diabetes (diabetes mellitus) include colds, fever, flu (influenza), nausea, vomiting, and diarrhea. These illnesses can cause stress and loss of body fluids (dehydration), and those issues can cause blood glucose levels to increase. Because of this, it is very important to take your insulin and diabetes medicines and eat some form of carbohydrate when you are sick. You should make a plan for days when you are sick (sick day plan) as part of your diabetes management plan. You and your health care provider should make this plan in advance. The following guidelines are intended to help you manage an illness that lasts for about 24 hours or less. Your health care provider may also give you more specific instructions. What do I need to do to manage my blood glucose?   Check your blood glucose every 2-4 hours, or as often as told by your health care provider.  Know your sick day treatment goals. Your target blood glucose levels may be different when you are sick.  If you use insulin, take your usual dose. ? If your blood glucose continues to be too high, you may need to take an additional insulin dose as told by your health care provider.  If you use oral diabetes medicine, you may need to stop taking it if you are not able to eat or drink normally. Ask your health care provider about whether you need to stop taking these medicines while you are sick.  If you use injectable hormone medicines other than insulin to control your diabetes, ask your health care provider about whether you need to stop taking these medicines while you are sick. What else can I do to manage my diabetes when I am sick? Check your ketones  If you have type 1 diabetes, check your urine ketones every 4 hours.  If you have type 2 diabetes, check your urine ketones  as often as told by your health care provider. Drink fluids  Drink enough fluid to keep your urine clear or pale yellow. This is especially important if you have a fever, vomiting, or diarrhea. Those symptoms can lead to dehydration.  Follow any instructions from your health care provider about beverages to avoid. ? Do not drink alcohol, caffeine, or drinks that contain a lot of sugar. Take medicines as directed  Take-over-the-counter and prescription medicines only as told by your health care provider.  Check medicine labels for added sugars. Some medicines may contain sugar or types of sugars that can raise your blood glucose level. What foods can I eat when I am sick?  You need to eat some form of carbohydrates when you are sick. You should eat 45-50 grams (45-50 g) of carbohydrates every 3-4 hours until you feel better. All of the food choices below contain about 15 g of carbohydrates. Plan ahead and keep some of these foods around so you have them if you get sick.  4-6 oz (120-177 mL) carbonated beverage that contains sugar, such as regular (not diet) soda. You may be able to drink carbonated beverages more easily if you open the beverage and let it sit at room temperature for a few minutes before drinking.   of a twin frozen ice pop.  4 oz (120 g) regular gelatin.  4 oz (120 mL) fruit juice.  4 oz (120 g) ice cream or frozen yogurt.  2  oz (60 g) sherbet.  8 oz (240 mL) clear broth or soup.  4 oz (120 g) regular custard.  4 oz (120 g) regular pudding.  8 oz (240 g) plain yogurt.  1 slice bread or toast.  6 saltine crackers.  5 vanilla wafers. Questions to ask your health care provider Consider asking the following questions so you know what to do on days when you are sick:  Should I adjust my diabetes medicines?  How often do I need to check my blood glucose?  What supplies do I need to manage my diabetes at home when I am sick?  What number can I call if I  have questions?  What foods and drinks should I avoid? Contact a health care provider if:  You develop symptoms of diabetic ketoacidosis, such as: ? Fatigue. ? Weight loss. ? Excessive thirst. ? Light-headedness. ? Fruity or sweet-smelling breath. ? Excessive urination. ? Vision changes. ? Confusion or irritability. ? Nausea. ? Vomiting. ? Rapid breathing. ? Pain in the abdomen. ? Feeling flushed.  You are unable to drink fluids without vomiting.  You have any of the following for more than 6 hours: ? Nausea. ? Vomiting. ? Diarrhea.  Your blood glucose is at or above 240 mg/dL (13.3 mmol/L), even after you take an additional insulin dose.  You have a change in how you think, feel, or act (mental status).  You develop another serious illness.  You have been sick or have had a fever for 2 days or longer and you are not getting better. Get help right away if:  Your blood glucose is lower than 54 mg/dL (3.0 mmol/L).  You have difficulty breathing.  You have moderate or high ketone levels in your urine.  You used emergency glucagon to treat low blood glucose. Summary  Blood sugar (glucose) can be difficult to control when you are sick. Common illnesses that can cause problems for people with diabetes (diabetes mellitus) include colds, fever, flu (influenza), nausea, vomiting, and diarrhea.  Illnesses can cause stress and loss of body fluids (dehydration), and those issues can cause blood glucose levels to increase.  Make a plan for days when you are sick (sick day plan) as part of your diabetes management plan. You and your health care provider should make this plan in advance.  It is very important to take your insulin and diabetes medicines and to eat some form of carbohydrate when you are sick.  Contact your health care provider if have problems managing your blood glucose levels when you are sick, or if you have been sick or had a fever for 2 days or longer and are  not getting better. This information is not intended to replace advice given to you by your health care provider. Make sure you discuss any questions you have with your health care provider. Document Revised: 01/11/2016 Document Reviewed: 01/11/2016 Elsevier Patient Education  [place]. Blood Glucose Monitoring, Adult Monitoring your blood sugar (glucose) is an important part of managing your diabetes (diabetes mellitus). Blood glucose monitoring involves checking your blood glucose as often as directed and keeping a record (log) of your results over time. Checking your blood glucose regularly and keeping a blood glucose log can:  Help you and your health care provider adjust your diabetes management plan as needed, including your medicines or insulin.  Help you understand how food, exercise, illnesses, and medicines affect your blood glucose.  Let you know what your blood glucose is at any  time. You can quickly find out if you have low blood glucose (hypoglycemia) or high blood glucose (hyperglycemia). Your health care provider will set individualized treatment goals for you. Your goals will be based on your age, other medical conditions you have, and how you respond to diabetes treatment. Generally, the goal of treatment is to maintain the following blood glucose levels:  Before meals (preprandial): 80-130 mg/dL (2.5-8.2 mmol/L).  After meals (postprandial): below 180 mg/dL (10 mmol/L).  A1c level: less than 7%. Supplies needed:  Blood glucose meter.  Test strips for your meter. Each meter has its own strips. You must use the strips that came with your meter.  A needle to prick your finger (lancet). Do not use a lancet more than one time.  A device that holds the lancet (lancing device).  A journal or log book to write down your results. How to check your blood glucose  1. Wash your hands with soap and water. 2. Prick the side of your finger (not the tip) with the  lancet. Use a different finger each time. 3. Gently rub the finger until a small drop of blood appears. 4. Follow instructions that come with your meter for inserting the test strip, applying blood to the strip, and using your blood glucose meter. 5. Write down your result and any notes. Some meters allow you to use areas of your body other than your finger (alternative sites) to test your blood. The most common alternative sites are:  Forearm.  Thigh.  Palm of the hand. If you think you may have hypoglycemia, or if you have a history of not knowing when your blood glucose is getting low (hypoglycemia unawareness), do not use alternative sites. Use your finger instead. Alternative sites may not be as accurate as the fingers, because blood flow is slower in these areas. This means that the result you get may be delayed, and it may be different from the result that you would get from your finger. Follow these instructions at home: Blood glucose log   Every time you check your blood glucose, write down your result. Also write down any notes about things that may be affecting your blood glucose, such as your diet and exercise for the day. This information can help you and your health care provider: ? Look for patterns in your blood glucose over time. ? Adjust your diabetes management plan as needed.  Check if your meter allows you to download your records to a computer. Most glucose meters store a record of glucose readings in the meter. If you have type 1 diabetes:  Check your blood glucose 2 or more times a day.  Also check your blood glucose: ? Before every insulin injection. ? Before and after exercise. ? Before meals. ? 2 hours after a meal. ? Occasionally between 2:00 a.m. and 3:00 a.m., as directed. ? Before potentially dangerous tasks, like driving or using heavy machinery. ? At bedtime.  You may need to check your blood glucose more often, up to 6-10 times a day, if you: ? Use  an insulin pump. ? Need multiple daily injections (MDI). ? Have diabetes that is not well-controlled. ? Are ill. ? Have a history of severe hypoglycemia. ? Have hypoglycemia unawareness. If you have type 2 diabetes:  If you take insulin or other diabetes medicines, check your blood glucose 2 or more times a day.  If you are on intensive insulin therapy, check your blood glucose 4 or more times a day.  Occasionally, you may also need to check between 2:00 a.m. and 3:00 a.m., as directed.  Also check your blood glucose: ? Before and after exercise. ? Before potentially dangerous tasks, like driving or using heavy machinery.  You may need to check your blood glucose more often if: ? Your medicine is being adjusted. ? Your diabetes is not well-controlled. ? You are ill. General tips  Always keep your supplies with you.  If you have questions or need help, all blood glucose meters have a 24-hour "hotline" phone number that you can call. You may also contact your health care provider.  After you use a few boxes of test strips, adjust (calibrate) your blood glucose meter by following instructions that came with your meter. Contact a health care provider if:  Your blood glucose is at or above 240 mg/dL (13.3 mmol/L) for 2 days in a row.  You have been sick or have had a fever for 2 days or longer, and you are not getting better.  You have any of the following problems for more than 6 hours: ? You cannot eat or drink. ? You have nausea or vomiting. ? You have diarrhea. Get help right away if:  Your blood glucose is lower than 54 mg/dL (3 mmol/L).  You become confused or you have trouble thinking clearly.  You have difficulty breathing.  You have moderate or large ketone levels in your urine. Summary  Monitoring your blood sugar (glucose) is an important part of managing your diabetes (diabetes mellitus).  Blood glucose monitoring involves checking your blood glucose as often  as directed and keeping a record (log) of your results over time.  Your health care provider will set individualized treatment goals for you. Your goals will be based on your age, other medical conditions you have, and how you respond to diabetes treatment.  Every time you check your blood glucose, write down your result. Also write down any notes about things that may be affecting your blood glucose, such as your diet and exercise for the day. This information is not intended to replace advice given to you by your health care provider. Make sure you discuss any questions you have with your health care provider. Document Revised: 02/05/2018 Document Reviewed: 09/24/2015 Elsevier Patient Education  [place]. Hypoglycemia Hypoglycemia is when the sugar (glucose) level in your blood is too low. Signs of low blood sugar may include:  Feeling: ? Hungry. ? Worried or nervous (anxious). ? Sweaty and clammy. ? Confused. ? Dizzy. ? Sleepy. ? Sick to your stomach (nauseous).  Having: ? A fast heartbeat. ? A headache. ? A change in your vision. ? Tingling or no feeling (numbness) around your mouth, lips, or tongue. ? Jerky movements that you cannot control (seizure).  Having trouble with: ? Moving (coordination). ? Sleeping. ? Passing out (fainting). ? Getting upset easily (irritability). Low blood sugar can happen to people who have diabetes and people who do not have diabetes. Low blood sugar can happen quickly, and it can be an emergency. Treating low blood sugar Low blood sugar is often treated by eating or drinking something sugary right away, such as:  Fruit juice, 4-6 oz (120-150 mL).  Regular soda (not diet soda), 4-6 oz (120-150 mL).  Low-fat milk, 4 oz (120 mL).  Several pieces of hard candy.  Sugar or honey, 1 Tbsp (15 mL). Treating low blood sugar if you have diabetes If you can think clearly and swallow safely, follow the 15:15 rule:  Take 15 grams of a  fast-acting carb (carbohydrate). Talk with your doctor about how much you should take.  Always keep a source of fast-acting carb with you, such as: ? Sugar tablets (glucose pills). Take 3-4 pills. ? 6-8 pieces of hard candy. ? 4-6 oz (120-150 mL) of fruit juice. ? 4-6 oz (120-150 mL) of regular (not diet) soda. ? 1 Tbsp (15 mL) honey or sugar.  Check your blood sugar 15 minutes after you take the carb.  If your blood sugar is still at or below 70 mg/dL (3.9 mmol/L), take 15 grams of a carb again.  If your blood sugar does not go above 70 mg/dL (3.9 mmol/L) after 3 tries, get help right away.  After your blood sugar goes back to normal, eat a meal or a snack within 1 hour.  Treating very low blood sugar If your blood sugar is at or below 54 mg/dL (3 mmol/L), you have very low blood sugar (severe hypoglycemia). This may also cause:  Passing out.  Jerky movements you cannot control (seizure).  Losing consciousness (coma). This is an emergency. Do not wait to see if the symptoms will go away. Get medical help right away. Call your local emergency services (911 in the [place] Do not drive yourself to the hospital. If you have very low blood sugar and you cannot eat or drink, you may need a glucagon shot (injection). A family member or friend should learn how to check your blood sugar and how to give you a glucagon shot. Ask your doctor if you need to have a glucagon shot kit at home. Follow these instructions at home: General instructions  Take over-the-counter and prescription medicines only as told by your doctor.  Stay aware of your blood sugar as told by your doctor.  Limit alcohol intake to no more than 1 drink a day for nonpregnant women and 2 drinks a day for men. One drink equals 12 oz of beer (355 mL), 5 oz of wine (148 mL), or 1 oz of hard liquor (44 mL).  Keep all follow-up visits as told by your doctor. This is important. If you have diabetes:   Follow your diabetes  care plan as told by your doctor. Make sure you: ? Know the signs of low blood sugar. ? Take your medicines as told. ? Follow your exercise and meal plan. ? Eat on time. Do not skip meals. ? Check your blood sugar as often as told by your doctor. Always check it before and after exercise. ? Follow your sick day plan when you cannot eat or drink normally. Make this plan ahead of time with your doctor.  Share your diabetes care plan with: ? Your work or school. ? People you live with.  Check your pee (urine) for ketones: ? When you are sick. ? As told by your doctor.  Carry a card or wear jewelry that says you have diabetes. Contact a doctor if:  You have trouble keeping your blood sugar in your target range.  You have low blood sugar often. Get help right away if:  You still have symptoms after you eat or drink something sugary.  Your blood sugar is at or below 54 mg/dL (3 mmol/L).  You have jerky movements that you cannot control.  You pass out. These symptoms may be an emergency. Do not wait to see if the symptoms will go away. Get medical help right away. Call your local emergency services (911 in the [place] Do  not drive yourself to the hospital. Summary  Hypoglycemia happens when the level of sugar (glucose) in your blood is too low.  Low blood sugar can happen to people who have diabetes and people who do not have diabetes. Low blood sugar can happen quickly, and it can be an emergency.  Make sure you know the signs of low blood sugar and know how to treat it.  Always keep a source of sugar (fast-acting carb) with you to treat low blood sugar. This information is not intended to replace advice given to you by your health care provider. Make sure you discuss any questions you have with your health care provider. Document Revised: 08/05/2018 Document Reviewed: 05/18/2015 Elsevier Patient Education  [place]. Hyperglycemia Hyperglycemia occurs when the level of  sugar (glucose) in the blood is too high. Glucose is a type of sugar that provides the body's main source of energy. Certain hormones (insulin and glucagon) control the level of glucose in the blood. Insulin lowers blood glucose, and glucagon increases blood glucose. Hyperglycemia can result from having too little insulin in the bloodstream, or from the body not responding normally to insulin. Hyperglycemia occurs most often in people who have diabetes (diabetes mellitus), but it can happen in people who do not have diabetes. It can develop quickly, and it can be life-threatening if it causes you to become severely dehydrated (diabetic ketoacidosis or hyperglycemic hyperosmolar state). Severe hyperglycemia is a medical emergency. What are the causes? If you have diabetes, hyperglycemia may be caused by:  Diabetes medicine.  Medicines that increase blood glucose or affect your diabetes control.  Not eating enough, or not eating often enough.  Changes in physical activity level.  Being sick or having an infection. If you have prediabetes or undiagnosed diabetes:  Hyperglycemia may be caused by those conditions. If you do not have diabetes, hyperglycemia may be caused by:  Certain medicines, including steroid medicines, beta-blockers, epinephrine, and thiazide diuretics.  Stress.  Serious illness.  Surgery.  Diseases of the pancreas.  Infection. What increases the risk? Hyperglycemia is more likely to develop in people who have risk factors for diabetes, such as:  Having a family member with diabetes.  Having a gene for type 1 diabetes that is passed from parent to child (inherited).  Living in an area with cold weather conditions.  Exposure to certain viruses.  Certain conditions in which the body's disease-fighting (immune) system attacks itself (autoimmune disorders).  Being overweight or obese.  Having an inactive (sedentary) lifestyle.  Having been diagnosed with  insulin resistance.  Having a history of prediabetes, gestational diabetes, or polycystic ovarian syndrome (PCOS).  Being of American-Indian, African-American, Hispanic/Latino, or Asian/Pacific Islander descent. What are the signs or symptoms? Hyperglycemia may not cause any symptoms. If you do have symptoms, they may include early warning signs, such as:  Increased thirst.  Hunger.  Feeling very tired.  Needing to urinate more often than usual.  Blurry vision. Other symptoms may develop if hyperglycemia gets worse, such as:  Dry mouth.  Loss of appetite.  Fruity-smelling breath.  Weakness.  Unexpected or rapid weight gain or weight loss.  Tingling or numbness in the hands or feet.  Headache.  Skin that does not quickly return to normal after being lightly pinched and released (poor skin turgor).  Abdominal pain.  Cuts or bruises that are slow to heal. How is this diagnosed? Hyperglycemia is diagnosed with a blood test to measure your blood glucose level. This blood test  is usually done while you are having symptoms. Your health care provider may also do a physical exam and review your medical history. You may have more tests to determine the cause of your hyperglycemia, such as:  A fasting blood glucose (FBG) test. You will not be allowed to eat (you will fast) for at least 8 hours before a blood sample is taken.  An A1c (hemoglobin A1c) blood test. This provides information about blood glucose control over the previous 2-3 months.  An oral glucose tolerance test (OGTT). This measures your blood glucose at two times: ? After fasting. This is your baseline blood glucose level. ? Two hours after drinking a beverage that contains glucose. How is this treated? Treatment depends on the cause of your hyperglycemia. Treatment may include:  Taking medicine to regulate your blood glucose levels. If you take insulin or other diabetes medicines, your medicine or dosage may  be adjusted.  Lifestyle changes, such as exercising more, eating healthier foods, or losing weight.  Treating an illness or infection, if this caused your hyperglycemia.  Checking your blood glucose more often.  Stopping or reducing steroid medicines, if these caused your hyperglycemia. If your hyperglycemia becomes severe and it results in hyperglycemic hyperosmolar state, you must be hospitalized and given IV fluids. Follow these instructions at home:  General instructions  Take over-the-counter and prescription medicines only as told by your health care provider.  Do not use any products that contain nicotine or tobacco, such as cigarettes and e-cigarettes. If you need help quitting, ask your health care provider.  Limit alcohol intake to no more than 1 drink per day for nonpregnant women and 2 drinks per day for men. One drink equals 12 oz of beer, 5 oz of wine, or 1 oz of hard liquor.  Learn to manage stress. If you need help with this, ask your health care provider.  Keep all follow-up visits as told by your health care provider. This is important. Eating and drinking   Maintain a healthy weight.  Exercise regularly, as directed by your health care provider.  Stay hydrated, especially when you exercise, get sick, or spend time in hot temperatures.  Eat healthy foods, such as: ? Lean proteins. ? Complex carbohydrates. ? Fresh fruits and vegetables. ? Low-fat dairy products. ? Healthy fats.  Drink enough fluid to keep your urine clear or pale yellow. If you have diabetes:  Make sure you know the symptoms of hyperglycemia.  Follow your diabetes management plan, as told by your health care provider. Make sure you: ? Take your insulin and medicines as directed. ? Follow your exercise plan. ? Follow your meal plan. Eat on time, and do not skip meals. ? Check your blood glucose as often as directed. Make sure to check your blood glucose before and after exercise. If  you exercise longer or in a different way than usual, check your blood glucose more often. ? Follow your sick day plan whenever you cannot eat or drink normally. Make this plan in advance with your health care provider.  Share your diabetes management plan with people in your workplace, school, and household.  Check your urine for ketones when you are ill and as told by your health care provider.  Carry a medical alert card or wear medical alert jewelry. Contact a health care provider if:  Your blood glucose is at or above 240 mg/dL (73.5 mmol/L) for 2 days in a row.  You have problems keeping your blood glucose  in your target range.  You have frequent episodes of hyperglycemia. Get help right away if:  You have difficulty breathing.  You have a change in how you think, feel, or act (mental status).  You have nausea or vomiting that does not go away. These symptoms may represent a serious problem that is an emergency. Do not wait to see if the symptoms will go away. Get medical help right away. Call your local emergency services (911 in the [place] Do not drive yourself to the hospital. Summary  Hyperglycemia occurs when the level of sugar (glucose) in the blood is too high.  Hyperglycemia is diagnosed with a blood test to measure your blood glucose level. This blood test is usually done while you are having symptoms. Your health care provider may also do a physical exam and review your medical history.  If you have diabetes, follow your diabetes management plan as told by your health care provider.  Contact your health care provider if you have problems keeping your blood glucose in your target range. This information is not intended to replace advice given to you by your health care provider. Make sure you discuss any questions you have with your health care provider. Document Revised: 12/31/2015 Document Reviewed: 12/31/2015 Elsevier Patient Education  [place]. Hemoglobin A1c Test Why am I having this test? You may have the hemoglobin A1c test (HbA1c test) done to:  Evaluate your risk for developing diabetes (diabetes mellitus).  Diagnose diabetes.  Monitor long-term control of blood sugar (glucose) in people who have diabetes and help make treatment decisions. This test may be done with other blood glucose tests, such as fasting blood glucose and oral glucose tolerance tests. What is being tested? Hemoglobin is a type of protein in the blood that carries oxygen. Glucose attaches to hemoglobin to form glycated hemoglobin. This test checks the amount of glycated hemoglobin in your blood, which is a good indicator of the average amount of glucose in your blood during the past 2-3 months. What kind of sample is taken?  A blood sample is required for this test. It is usually collected by inserting a needle into a blood vessel. Tell a health care provider about:  All medicines you are taking, including vitamins, herbs, eye drops, creams, and over-the-counter medicines.  Any blood disorders you have.  Any surgeries you have had.  Any medical conditions you have.  Whether you are pregnant or may be pregnant. How are the results reported? Your results will be reported as a percentage that indicates how much of your hemoglobin has glucose attached to it (is glycated). Your health care provider will compare your results to normal ranges that were established after testing a large group of people (reference ranges). Reference ranges may vary among labs and hospitals. For this test, common reference ranges are:  Adult or child without diabetes: 4-5.6%.  Adult or child with diabetes and good blood glucose control: less than 7%. What do the results mean? If you have diabetes:  A result of less than 7% is considered normal, meaning that your blood glucose is well controlled.  A result higher than 7% means that your blood glucose is not well  controlled, and your treatment plan may need to be adjusted. If you do not have diabetes:  A result within the reference range is considered normal, meaning that you are not at high risk for diabetes.  A result of 5.7-6.4% means that you have a high risk of developing  diabetes, and you may have prediabetes. Prediabetes is the condition of having a blood glucose level that is higher than it should be, but not high enough for you to be diagnosed with diabetes. Having prediabetes puts you at risk for developing type 2 diabetes (type 2 diabetes mellitus). You may have more tests, including a repeat HbA1c test.  Results of 6.5% or higher on two separate HbA1c tests mean that you have diabetes. You may have more tests to confirm the diagnosis. Abnormally low HbA1c values may be caused by:  Pregnancy.  Severe blood loss.  Receiving donated blood (transfusions).  Low red blood cell count (anemia).  Long-term kidney failure.  Some unusual forms (variants) of hemoglobin. Talk with your health care provider about what your results mean. Questions to ask your health care provider Ask your health care provider, or the department that is doing the test:  When will my results be ready?  How will I get my results?  What are my treatment options?  What other tests do I need?  What are my next steps? Summary  The hemoglobin A1c test (HbA1c test) may be done to evaluate your risk for developing diabetes, to diagnose diabetes, and to monitor long-term control of blood sugar (glucose) in people who have diabetes and help make treatment decisions.  Hemoglobin is a type of protein in the blood that carries oxygen. Glucose attaches to hemoglobin to form glycated hemoglobin. This test checks the amount of glycated hemoglobin in your blood, which is a good indicator of the average amount of glucose in your blood during the past 2-3 months.  Talk with your health care provider about what your results  mean. This information is not intended to replace advice given to you by your health care provider. Make sure you discuss any questions you have with your health care provider. Document Revised: 03/27/2017 Document Reviewed: 11/25/2016 Elsevier Patient Education  [place] With Diabetes  Foods with carbohydrates make your blood glucose level go up. Learning how to count carbohydrates can help you control your blood glucose levels. First, identify the foods you eat that contain carbohydrates. Then, using the Foods with Carbohydrates chart, determine about how much carbohydrates are in your meals and snacks. Make sure you are eating foods with fiber, protein, and healthy fat along with your carbohydrate foods. Foods with Carbohydrates The following table shows carbohydrate foods that have about 15 grams of carbohydrate each. Using measuring cups, spoons, or a food scale when you first begin learning about carbohydrate counting can help you learn about the portion sizes you typically eat. The following foods have 15 grams carbohydrate each:  Grains  1 slice bread (1 ounce)   1 small tortilla (6-inch size)    large bagel (1 ounce)   1/3 cup pasta or rice (cooked)    hamburger or hot dog bun ( ounce)    cup cooked cereal    to  cup ready-to-eat cereal   2 taco shells (5-inch size) Fruit  1 small fresh fruit ( to 1 cup)    medium banana   17 small grapes (3 ounces)   1 cup melon or berries    cup canned or frozen fruit   2 tablespoons dried fruit (blueberries, cherries, cranberries, raisins)    cup unsweetened fruit juice  Starchy Vegetables   cup cooked beans, peas, corn, potatoes/sweet potatoes    large baked potato (3 ounces)   1 cup acorn or butternut  squash  Snack Foods  3 to 6 crackers   8 potato chips or 13 tortilla chips ( ounce to 1 ounce)   3 cups popped popcorn  Dairy  3/4 cup (6 ounces) nonfat  plain yogurt, or yogurt with sugar-free sweetener   1 cup milk   1 cup plain rice, soy, coconut or flavored almond milk Sweets and Desserts   cup ice cream or frozen yogurt   1 tablespoon jam, jelly, pancake syrup, table sugar, or honey   2 tablespoons light pancake syrup   1 inch square of frosted cake or 2 inch square of unfrosted cake   2 small cookies (2/3 ounce each) or  large cookie  Sometimes youll have to estimate carbohydrate amounts if you dont know the exact recipe. One cup of mixed foods like soups can have 1 to 2 carbohydrate servings, while some casseroles might have 2 or more servings of carbohydrate. Foods that have less than 20 calories in each serving can be counted as free foods. Count 1 cup raw vegetables, or  cup cooked non-starchy vegetables as free foods. If you eat 3 or more servings at one meal, then count them as 1 carbohydrate serving.  Foods without Carbohydrates  Not all foods contain carbohydrates. Meat, some dairy, fats, non-starchy vegetables, and many beverages dont contain carbohydrate. So when you count carbohydrates, you can generally exclude chicken, pork, beef, fish, seafood, eggs, tofu, cheese, butter, sour cream, avocado, nuts, seeds, olives, mayonnaise, water, black coffee, unsweetened tea, and zero-calorie drinks. Vegetables with no or low carbohydrate include green beans, cauliflower, tomatoes, and onions. How much carbohydrate should I eat at each meal?  Carbohydrate counting can help you plan your meals and manage your weight. Following are some starting points for carbohydrate intake at each meal. Work with your registered dietitian nutritionist to find the best range that works for your blood glucose and weight.   To Lose Weight To Maintain Weight  Women 2 - 3 carb servings 3 - 4 carb servings  Men 3 - 4 carb servings 4 - 5 carb servings  Checking your blood glucose after meals will help you know if you need to adjust the timing, type,  or number of carbohydrate servings in your meal plan. Achieve and keep a healthy body weight by balancing your food intake and physical activity.  Tips How should I plan my meals?  Plan for half the food on your plate to include non-starchy vegetables, like salad greens, broccoli, or carrots. Try to eat 3 to 5 servings of non-starchy vegetables every day. Have a protein food at each meal. Protein foods include chicken, fish, meat, eggs, or beans (note that beans contain carbohydrate). These two food groups (non-starchy vegetables and proteins) are low in carbohydrate. If you fill up your plate with these foods, you will eat less carbohydrate but still fill up your stomach. Try to limit your carbohydrate portion to  of the plate.  What fats are healthiest to eat?  Diabetes increases risk for heart disease. To help protect your heart, eat more healthy fats, such as olive oil, nuts, and avocado. Eat less saturated fats like butter, cream, and high-fat meats, like bacon and sausage. Avoid trans fats, which are in all foods that list partially hydrogenated oil as an ingredient. What should I drink?  Choose drinks that are not sweetened with sugar. The healthiest choices are water, carbonated or seltzer waters, and tea and coffee without added sugars.  Sweet drinks will make your  blood glucose go up very quickly. One serving of soda or energy drink is  cup. It is best to drink these beverages only if your blood glucose is low.  Artificially sweetened, or diet drinks, typically do not increase your blood glucose if they have zero calories in them. Read labels of beverages, as some diet drinks do have carbohydrate and will raise your blood glucose. Label Reading Tips Read Nutrition Facts labels to find out how many grams of carbohydrate are in a food you want to eat. Dont forget: sometimes serving sizes on the label arent the same as how much food you are going to eat, so you may need to calculate how much  carbohydrate is in the food you are serving yourself.   Carbohydrate Counting for People with Diabetes Sample 1-Day Menu  Breakfast  cup yogurt, low fat, low sugar (1 carbohydrate serving)   cup cereal, ready-to-eat, unsweetened (1 carbohydrate serving)  1 cup strawberries (1 carbohydrate serving)   cup almonds ( carbohydrate serving)  Lunch 1, 5 ounce can chunk light tuna  2 ounces cheese, low fat cheddar  6 whole wheat crackers (1 carbohydrate serving)  1 small apple (1 carbohydrate servings)   cup carrots ( carbohydrate serving)   cup snap peas  1 cup 1% milk (1 carbohydrate serving)   Evening Meal Stir fry made with: 3 ounces chicken  1 cup brown rice (3 carbohydrate servings)   cup broccoli ( carbohydrate serving)   cup green beans   cup onions  1 tablespoon olive oil  2 tablespoons teriyaki sauce ( carbohydrate serving)  Evening Snack 1 extra small banana (1 carbohydrate serving)  1 tablespoon peanut butter   Carbohydrate Counting for People with Diabetes Vegan Sample 1-Day Menu  Breakfast 1 cup cooked oatmeal (2 carbohydrate servings)   cup blueberries (1 carbohydrate serving)  2 tablespoons flaxseeds  1 cup soymilk fortified with calcium and vitamin D  1 cup coffee  Lunch 2 slices whole wheat bread (2 carbohydrate servings)   cup baked tofu   cup lettuce  2 slices tomato  2 slices avocado   cup baby carrots ( carbohydrate serving)  1 orange (1 carbohydrate serving)  1 cup soymilk fortified with calcium and vitamin D   Evening Meal Burrito made with: 1 6-inch corn tortilla (1 carbohydrate serving)  1 cup refried vegetarian beans (2 carbohydrate servings)   cup chopped tomatoes   cup lettuce   cup salsa  1/3 cup brown rice (1 carbohydrate serving)  1 tablespoon olive oil for rice   cup zucchini   Evening Snack 6 small whole grain crackers (1 carbohydrate serving)  2 apricots ( carbohydrate serving)   cup unsalted peanuts ( carbohydrate  serving)    Carbohydrate Counting for People with Diabetes Vegetarian (Lacto-Ovo) Sample 1-Day Menu  Breakfast 1 cup cooked oatmeal (2 carbohydrate servings)   cup blueberries (1 carbohydrate serving)  2 tablespoons flaxseeds  1 egg  1 cup 1% milk (1 carbohydrate serving)  1 cup coffee  Lunch 2 slices whole wheat bread (2 carbohydrate servings)  2 ounces low-fat cheese   cup lettuce  2 slices tomato  2 slices avocado   cup baby carrots ( carbohydrate serving)  1 orange (1 carbohydrate serving)  1 cup unsweetened tea  Evening Meal Burrito made with: 1 6-inch corn tortilla (1 carbohydrate serving)   cup refried vegetarian beans (1 carbohydrate serving)   cup tomatoes   cup lettuce   cup salsa  1/3 cup brown  rice (1 carbohydrate serving)  1 tablespoon olive oil for rice   cup zucchini  1 cup 1% milk (1 carbohydrate serving)  Evening Snack 6 small whole grain crackers (1 carbohydrate serving)  2 apricots ( carbohydrate serving)   cup unsalted peanuts ( carbohydrate serving)    Copyright 2020  Academy of Nutrition and Dietetics. All rights reserved.  Using Nutrition Labels: Carbohydrate   Serving Size   Look at the serving size. All the information on the label is based on this portion.  Servings Per Container   The number of servings contained in the package.  Guidelines for Carbohydrate   Look at the total grams of carbohydrate in the serving size.   1 carbohydrate choice = 15 grams of carbohydrate. Range of Carbohydrate Grams Per Choice  Carbohydrate Grams/Choice Carbohydrate Choices  6-10   11-20 1  21-25 1  26-35 2  36-40 2  41-50 3  51-55 3  56-65 4  66-70 4  71-80 5    Copyright 2020  Academy of Nutrition and Dietetics. All rights reserved.

## 2020-04-25 NOTE — Progress Notes (Signed)
Inpatient Diabetes Program Recommendations  AACE/ADA: New Consensus Statement on Inpatient Glycemic Control (2015)  Target Ranges:  Prepandial:   less than 140 mg/dL      Peak postprandial:   less than 180 mg/dL (1-2 hours)      Critically ill patients:  140 - 180 mg/dL   Lab Results  Component Value Date   GLUCAP 179 (H) 04/25/2020   HGBA1C 14.3 (H) 04/24/2020    Review of Glycemic Control Results for Miguel Boone, Miguel Boone (MRN [id number]) as of 04/25/2020 08:49  Ref. Range 04/25/2020 07:16 04/25/2020 08:26  Glucose-Capillary Latest Ref Range: 70 - 99 mg/dL 532 (H) 023 (H)    Diabetes history: Type 2 DM Outpatient Diabetes medications: none Current orders for Inpatient glycemic control: IV insulin  Inpatient Diabetes Program Recommendations:     When ready to transition consider:  -Levemir 45 units 2 hours prior to discontinuation of IV insulin, then QD to follow -Novolog 3 units TID (assuming patient is consuming >50% of meal) -Novolog 0-20 units TID & HS.   Will plan to see patient again today.   Thanks, Lujean Rave, MSN, RNC-OB Diabetes Coordinator [phone] (8a-5p)

## 2020-04-25 NOTE — Plan of Care (Signed)
  RD consulted for nutrition education regarding diabetes.   Lab Results  Component Value Date   HGBA1C 14.3 (H) 04/24/2020    RD provided "Carbohydrate Counting for People with Diabetes" handout from the Academy of Nutrition and Dietetics. Discussed different food groups and their effects on blood sugar, emphasizing carbohydrate-containing foods. Provided list of carbohydrates and recommended serving sizes of common foods.  Discussed importance of controlled and consistent carbohydrate intake throughout the day. Provided examples of ways to balance meals/snacks and encouraged intake of high-fiber, whole grain complex carbohydrates. Teach back method used.  Patient endorses eating two meal daily that consist of L- bologna or PB sandwich and D- baked chicken, rice, and broccoli. Drinks water, Gatorade, and OJ. Discussed A1C/current reading, the importance of eat three meals daily, how to make meals balanced, which foods contain carbohydrates, how to read a food label, and how to make appropriate beverage substitutions.    Labs and medications reviewed. No further nutrition interventions warranted at this time. RD contact information provided. If additional nutrition issues arise, please re-consult RD.  Vanessa Kick RD, LDN Clinical Nutrition Pager listed in [facility]

## 2020-04-25 NOTE — Care Management Obs Status (Signed)
MEDICARE OBSERVATION STATUS NOTIFICATION   Patient Details  Name: MACEDONIO SCALLON MRN: [id number] Date of Birth: 1950/11/27   Medicare Observation Status Notification Given:  Yes    Lawerance Sabal, RN 04/25/2020, 4:48 PM

## 2020-04-26 DIAGNOSIS — N179 Acute kidney failure, unspecified: Secondary | ICD-10-CM | POA: Diagnosis not present

## 2020-04-26 DIAGNOSIS — I10 Essential (primary) hypertension: Secondary | ICD-10-CM | POA: Diagnosis not present

## 2020-04-26 DIAGNOSIS — E875 Hyperkalemia: Secondary | ICD-10-CM | POA: Diagnosis not present

## 2020-04-26 DIAGNOSIS — R17 Unspecified jaundice: Secondary | ICD-10-CM | POA: Diagnosis not present

## 2020-04-26 DIAGNOSIS — E782 Mixed hyperlipidemia: Secondary | ICD-10-CM | POA: Diagnosis not present

## 2020-04-26 DIAGNOSIS — E1011 Type 1 diabetes mellitus with ketoacidosis with coma: Secondary | ICD-10-CM | POA: Diagnosis not present

## 2020-04-26 DIAGNOSIS — E1110 Type 2 diabetes mellitus with ketoacidosis without coma: Secondary | ICD-10-CM | POA: Diagnosis not present

## 2020-04-26 LAB — GLUCOSE, CAPILLARY
Glucose-Capillary: 191 mg/dL — ABNORMAL HIGH (ref 70–99)
Glucose-Capillary: 287 mg/dL — ABNORMAL HIGH (ref 70–99)

## 2020-04-26 LAB — PROCALCITONIN: Procalcitonin: <0.1 ng/mL

## 2020-04-26 MED ORDER — FREESTYLE LANCETS MISC: 5 refills | Status: AC

## 2020-04-26 MED ORDER — PEN NEEDLES 31G X 5 MM MISC: 1.0000 | Freq: Two times a day (BID) | 0 refills | Status: AC

## 2020-04-26 MED ORDER — INSULIN ASPART PROT & ASPART (70-30 MIX) 100 UNIT/ML PEN: 50.0000 [IU] | PEN_INJECTOR | Freq: Every day | SUBCUTANEOUS | 11 refills | Status: AC

## 2020-04-26 MED ORDER — FREESTYLE TEST VI STRP: ORAL_STRIP | 5 refills | Status: AC

## 2020-04-26 MED ORDER — INSULIN ASPART PROT & ASPART (70-30 MIX) 100 UNIT/ML PEN: 60.0000 [IU] | PEN_INJECTOR | Freq: Every day | SUBCUTANEOUS | 11 refills | Status: AC

## 2020-04-26 NOTE — Progress Notes (Signed)
Inpatient Diabetes Program Recommendations  AACE/ADA: New Consensus Statement on Inpatient Glycemic Control (2015)  Target Ranges:  Prepandial:   less than 140 mg/dL      Peak postprandial:   less than 180 mg/dL (1-2 hours)      Critically ill patients:  140 - 180 mg/dL   Lab Results  Component Value Date   GLUCAP 191 (H) 04/26/2020   HGBA1C 14.3 (H) 04/24/2020    Review of Glycemic Control Results for Miguel Boone, Miguel Boone (MRN [id number]) as of 04/26/2020 07:42  Ref. Range 04/25/2020 08:26 04/25/2020 10:43 04/25/2020 17:24 04/25/2020 21:56 04/26/2020 06:41  Glucose-Capillary Latest Ref Range: 70 - 99 mg/dL 017 (H) 793 (H) 903 (H) 297 (H) 191 (H)   Current orders for Inpatient glycemic control:  70/30 55 units QAM & 45 units with supper Novolog 4 units tid with meals Novolog 0-20 units tid Novolog 0-5 unit qhs  Inpatient Diabetes Program Recommendations:    Please discontinue Novolog 4 units TID with meals as he is receiving 30% meal coverage in the Novolog 70/30 mix.    Will continue to follow while inpatient.  Thank you, Dulce Sellar, RN, BSN Diabetes Coordinator Inpatient Diabetes Program [phone] (team pager from 8a-5p)

## 2020-04-26 NOTE — Progress Notes (Signed)
Order received to discharge patient.  Telemetry monitor removed and CCMD notified.  PIV access removed.  Discharge information, follow up, medications, supplies and instructions for their use discussed with patient.

## 2020-04-27 NOTE — Discharge Summary (Signed)
Physician Discharge Summary  Miguel Boone [id number] DOB: 10-09-1950 DOA: 04/23/2020  PCP: Merrilee Seashore, MD  Admit date: 04/23/2020 Discharge date: 04/27/2020  Recommendations for Outpatient Follow-up:  1. Discharge to home. 2. Check glucoses twice a day. Once in the morning before breakfast and once in the evening before dinner. He is to record these glucose readings and take them in with him for visit with PCP. 3. Follow up with PCP in 10 days. Have chemistry checked on this date and it should be reported to PCP.  Discharge Diagnoses: Principal diagnosis is #1 1. DKA 2. AKI 3. Hyperkalemia 4. Morbid Obesity 5. Tachycardia 6. Elevated troponin due to demand ischemia  Discharge Condition: Fair  Disposition: Home  Diet recommendation: Modified carbohydrates  Filed Weights   04/24/20 0400 04/24/20 1459  Weight: (!) 151 kg 133.1 kg    History of present illness:  Miguel Boone is a 69 y.o. male with medical history significant of DM2, HTN, HLD, morbid obesity, microalbuminuria.  Despite Dr. Ashby Dawes telling patient that he was diabetic "for 10 years" it sounds like pt declined to take chronic medications from the sounds of it.  Difficult to tell entirely though as I can only see the continuity of care documents from [facility].  Regardless, patient presents to ED today with 1 week h/o N/V/D, dry mouth, loss of taste, sore throat.  Symptoms are severe, constant.  Nothing makes better or worse.  Pt had COVID earlier this year, got fully vaccinated this fall.  Hospital Course: Patient is 69 year old male with past medical history of type 2 diabetes mellitus, hypertension, hyperlipidemia, morbid obesity, microalbuminemia presents to emergency department with dry mouth, nausea, vomiting and diarrhea and sore throat.  Despite his PCP telling patient that he was diabetic for 10 years it sounds like patient declined to take medications.  Patient had Covid earlier this year," fully vaccinated this fall. No fever or chills.  Upon arrival to ED: Patient blood glucose noted to be 636, anion gap: 22, mild AKI, creatinine 1.57, potassium 6.3, beta hydroxybutyric acid: 6.14, patient tachycardic, tachypneic, A1c: 14.3. Patient was found to be in DKA and was started on IV insulin GTT and admitted for further evaluation and management.  [facility] were consulted to admit the patient for further evaluation and treatment. The patient was placed on endotool. His anion gap has closed and his Beta hydroxybutyric acid has normalized. He was started on subcutaneous lantus with FSBS and SSI. His glucoses after this was initiated have been 200-297.   The patient has had diabetic teaching. He states that he now understands that he must take insulin.  The patient will be discharged to home today in fair condition.  Today's assessment: S: The patient is resting comfortably. No new complaints. O: Vitals:  Vitals:   04/26/20 0739 04/26/20 1051  BP: 127/77 (!) 148/85  Pulse:  83  Resp: 18 18  Temp: 98.1 F (36.7 C) 98 F (36.7 C)  SpO2: 96% 93%   Exam:  Constitutional:   The patient is awake, alert, and oriented x 3. No acute distress. Respiratory:   No increased work of breathing.  No wheezes, rales, or rhonchi  No tactile fremitus Cardiovascular:   Regular rate and rhythm  No murmurs, ectopy, or gallups.  No lateral PMI. No thrills. Abdomen:   Abdomen is soft, non-tender, non-distended  No hernias, masses, or organomegaly  Normoactive bowel sounds.  Musculoskeletal:   No cyanosis, clubbing, or edema Skin:   No  rashes, lesions, ulcers  palpation of skin: no induration or nodules Neurologic:   CN 2-12 intact  Sensation all 4 extremities intact Psychiatric:   Mental status ? Mood, affect appropriate ? Orientation to person, place, time   judgment and insight appear intact  Discharge  Instructions  Discharge Instructions    Activity as tolerated - No restrictions   Complete by: As directed    Call MD for:  difficulty breathing, headache or visual disturbances   Complete by: As directed    Call MD for:  persistant nausea and vomiting   Complete by: As directed    Call MD for:  redness, tenderness, or signs of infection (pain, swelling, redness, odor or green/yellow discharge around incision site)   Complete by: As directed    Call MD for:  severe uncontrolled pain   Complete by: As directed    Diet - low sodium heart healthy   Complete by: As directed    Diet Carb Modified   Complete by: As directed    Discharge instructions   Complete by: As directed    Discharge to home. Check glucoses twice a day. Once in the morning before breakfast and once in the evening before dinner. He is to record these glucose readings and take them in with him for visit with PCP. Follow up with PCP in 10 days. Have chemistry checked on this date and it should be reported to PCP.   For home use only DME Glucometer   Complete by: As directed    Increase activity slowly   Complete by: As directed      Allergies as of 04/26/2020   No Known Allergies     Medication List    STOP taking these medications   azithromycin 250 MG tablet Commonly known as: ZITHROMAX   BROMFED DM PO     TAKE these medications   freestyle lancets Check glucoses twice daily. Once in the morning before breakfast and once in the evening before dinner.   FREESTYLE TEST STRIPS test strip Generic drug: glucose blood Check glucoses twice daily. Once in the morning before breakfast and once in the evening before dinner.   insulin aspart protamine - aspart (70-30) 100 UNIT/ML FlexPen Commonly known as: NOVOLOG 70/30 MIX Inject 0.6 mLs (60 Units total) into the skin daily with breakfast.   insulin aspart protamine - aspart (70-30) 100 UNIT/ML FlexPen Commonly known as: NOVOLOG 70/30 MIX Inject 0.5 mLs (50  Units total) into the skin daily with supper.   Pen Needles 31G X 5 MM Misc 1 each by Does not apply route in the morning and at bedtime.            Durable Medical Equipment  (From admission, onward)         Start     Ordered   04/26/20 0000  For home use only DME Glucometer        04/26/20 1013         No Known Allergies  The results of significant diagnostics from this hospitalization (including imaging, microbiology, ancillary and laboratory) are listed below for reference.    Significant Diagnostic Studies: DG Chest Port 1 View  Result Date: 04/24/2020 CLINICAL DATA:  Sore throat EXAM: PORTABLE CHEST 1 VIEW COMPARISON:  None. FINDINGS: The heart size and mediastinal contours are within normal limits. Mildly hazy airspace opacity seen at the right lung base. The visualized skeletal structures are unremarkable. IMPRESSION: Mildly hazy airspace opacity at the right lung base  which may be due to atelectasis and/or infectious etiology. Electronically Signed   By: Prudencio Pair M.D.   On: 04/24/2020 03:53    Microbiology: Recent Results (from the past 240 hour(s))  Resp Panel by RT-PCR (Flu A&B, Covid) Nasopharyngeal Swab     Status: None   Collection Time: 04/23/20  7:10 PM   Specimen: Nasopharyngeal Swab; Nasopharyngeal(NP) swabs in vial transport medium  Result Value Ref Range Status   SARS Coronavirus 2 by RT PCR NEGATIVE NEGATIVE Final    Comment: (NOTE) SARS-CoV-2 target nucleic acids are NOT DETECTED.  The SARS-CoV-2 RNA is generally detectable in upper respiratory specimens during the acute phase of infection. The lowest concentration of SARS-CoV-2 viral copies this assay can detect is 138 copies/mL. A negative result does not preclude SARS-Cov-2 infection and should not be used as the sole basis for treatment or other patient management decisions. A negative result may occur with  improper specimen collection/handling, submission of specimen other than  nasopharyngeal swab, presence of viral mutation(s) within the areas targeted by this assay, and inadequate number of viral copies(<138 copies/mL). A negative result must be combined with clinical observations, patient history, and epidemiological information. The expected result is Negative.  Fact Sheet for Patients:  EntrepreneurPulse.com.au  Fact Sheet for Healthcare Providers:  IncredibleEmployment.be  This test is no t yet approved or cleared by the [place] FDA and  has been authorized for detection and/or diagnosis of SARS-CoV-2 by FDA under an Emergency Use Authorization (EUA). This EUA will remain  in effect (meaning this test can be used) for the duration of the COVID-19 declaration under Section 564(b)(1) of the Act, 21 U.S.C.section 360bbb-3(b)(1), unless the authorization is terminated  or revoked sooner.       Influenza A by PCR NEGATIVE NEGATIVE Final   Influenza B by PCR NEGATIVE NEGATIVE Final    Comment: (NOTE) The Xpert Xpress SARS-CoV-2/FLU/RSV plus assay is intended as an aid in the diagnosis of influenza from Nasopharyngeal swab specimens and should not be used as a sole basis for treatment. Nasal washings and aspirates are unacceptable for Xpert Xpress SARS-CoV-2/FLU/RSV testing.  Fact Sheet for Patients: EntrepreneurPulse.com.au  Fact Sheet for Healthcare Providers: IncredibleEmployment.be  This test is not yet approved or cleared by the [place] FDA and has been authorized for detection and/or diagnosis of SARS-CoV-2 by FDA under an Emergency Use Authorization (EUA). This EUA will remain in effect (meaning this test can be used) for the duration of the COVID-19 declaration under Section 564(b)(1) of the Act, 21 U.S.C. section 360bbb-3(b)(1), unless the authorization is terminated or revoked.  Performed at [facility], [place] [address], [place], [place] [address]       Labs: Basic Metabolic Panel: Recent Labs  Lab 04/24/20 0346 04/24/20 0545 04/24/20 1138 04/24/20 1534 04/24/20 2002 04/24/20 2331 04/25/20 0204  NA 135   < > 143 146* 145 144 147*  K 6.0*   < > 4.1 4.0 4.1 4.4 3.6  CL 95*   < > 103 103 105 108 108  CO2 16*   < > 25 28 26  21* 27  GLUCOSE 763*   < > 297* 220* 227* 238* 200*  BUN 38*   < > 38* 35* 35* 36* 32*  CREATININE 2.01*   < > 1.69* 1.37* 1.27* 1.24 1.19  CALCIUM 10.0   < > 10.0 9.8 9.6 9.3 9.5  MG 2.5*  --   --   --   --   --  2.3  PHOS 7.9*  --   --   --   --   --   --    < > = values in this interval not displayed.   Liver Function Tests: Recent Labs  Lab 04/23/20 2004  AST 23  ALT 36  ALKPHOS 84  BILITOT 1.7*  PROT 8.5*  ALBUMIN 4.3   Recent Labs  Lab 04/23/20 2004  LIPASE 25   No results for input(s): AMMONIA in the last 168 hours. CBC: Recent Labs  Lab 04/23/20 2004 04/24/20 0545 04/25/20 0204  WBC 5.7  --  5.9  HGB 16.5 17.0 14.6  HCT 52.1* 50.0 44.3  MCV 82.2  --  80.0  PLT 152  --  118*   Cardiac Enzymes: No results for input(s): CKTOTAL, CKMB, CKMBINDEX, TROPONINI in the last 168 hours. BNP: BNP (last 3 results) No results for input(s): BNP in the last 8760 hours.  ProBNP (last 3 results) No results for input(s): PROBNP in the last 8760 hours.  CBG: Recent Labs  Lab 04/25/20 1043 04/25/20 1724 04/25/20 2156 04/26/20 0641 04/26/20 1046  GLUCAP 303* 299* 297* 191* 287*    Principal Problem:   DKA, type 2 (HCC) Active Problems:   AKI (acute kidney injury) ([place])   HTN (hypertension)   HLD (hyperlipidemia)   Time coordinating discharge: 38 minutes  Signed:        Maygan Koeller, DO [facility]  04/27/2020, 7:40 AM

## 2020-04-30 ENCOUNTER — Other Ambulatory Visit: Payer: Self-pay

## 2020-04-30 NOTE — Patient Instructions (Addendum)
Goals Addressed            This Visit's Progress   . THN-Make and Keep All Appointments       Timeframe:  Long-Range Goal Priority:  High Start Date:   04/30/20                          Expected End Date:   06/25/20                    Follow Up Date 06/25/20    - ask family or friend for a ride - keep a calendar with appointment dates    Why is this important?    Part of staying healthy is seeing the doctor for follow-up care.   If you forget your appointments, there are some things you can do to stay on track.    Notes:     . THN-Monitor and Manage My Blood Sugar-Diabetes Type 2       Timeframe:  Long-Range Goal Priority:  High Start Date:  04/30/20                           Expected End Date:    06/25/20                   Follow Up Date 06/25/20   - check blood sugar at prescribed times - check blood sugar if I feel it is too high or too low - enter blood sugar readings and medication or insulin into daily log - take the blood sugar log to all doctor visits    Why is this important?    Checking your blood sugar at home helps to keep it from getting very high or very low.   Writing the results in a diary or log helps the doctor know how to care for you.   Your blood sugar log should have the time, date and the results.   Also, write down the amount of insulin or other medicine that you take.   Other information, like what you ate, exercise done and how you were feeling, will also be helpful.     Notes:     . THN-Obtain Eye Exam-Diabetes Type 2       Timeframe:  Short-Term Goal Priority:  High Start Date:    04/30/20                         Expected End Date:   06/25/20                   Follow Up Date 06/25/20   - schedule appointment with eye doctor    Why is this important?    Eye check-ups are important when you have diabetes.   Vision loss can be prevented.    Notes: Encouraged patient to schedule an appointment with eye doctor.       Blood Glucose  Monitoring, Adult Monitoring your blood sugar (glucose) is an important part of managing your diabetes (diabetes mellitus). Blood glucose monitoring involves checking your blood glucose as often as directed and keeping a record (log) of your results over time. Checking your blood glucose regularly and keeping a blood glucose log can:  Help you and your health care provider adjust your diabetes management plan as needed, including your medicines or insulin.  Help you understand how food, exercise,  illnesses, and medicines affect your blood glucose.  Let you know what your blood glucose is at any time. You can quickly find out if you have low blood glucose (hypoglycemia) or high blood glucose (hyperglycemia). Your health care provider will set individualized treatment goals for you. Your goals will be based on your age, other medical conditions you have, and how you respond to diabetes treatment. Generally, the goal of treatment is to maintain the following blood glucose levels:  Before meals (preprandial): 80-130 mg/dL (4.4-7.2 mmol/L).  After meals (postprandial): below 180 mg/dL (10 mmol/L).  A1c level: less than 7%. Supplies needed:  Blood glucose meter.  Test strips for your meter. Each meter has its own strips. You must use the strips that came with your meter.  A needle to prick your finger (lancet). Do not use a lancet more than one time.  A device that holds the lancet (lancing device).  A journal or log book to write down your results. How to check your blood glucose  1. Wash your hands with soap and water. 2. Prick the side of your finger (not the tip) with the lancet. Use a different finger each time. 3. Gently rub the finger until a small drop of blood appears. 4. Follow instructions that come with your meter for inserting the test strip, applying blood to the strip, and using your blood glucose meter. 5. Write down your result and any notes. Some meters allow you to use  areas of your body other than your finger (alternative sites) to test your blood. The most common alternative sites are:  Forearm.  Thigh.  Palm of the hand. If you think you may have hypoglycemia, or if you have a history of not knowing when your blood glucose is getting low (hypoglycemia unawareness), do not use alternative sites. Use your finger instead. Alternative sites may not be as accurate as the fingers, because blood flow is slower in these areas. This means that the result you get may be delayed, and it may be different from the result that you would get from your finger. Follow these instructions at home: Blood glucose log   Every time you check your blood glucose, write down your result. Also write down any notes about things that may be affecting your blood glucose, such as your diet and exercise for the day. This information can help you and your health care provider: ? Look for patterns in your blood glucose over time. ? Adjust your diabetes management plan as needed.  Check if your meter allows you to download your records to a computer. Most glucose meters store a record of glucose readings in the meter. If you have type 1 diabetes:  Check your blood glucose 2 or more times a day.  Also check your blood glucose: ? Before every insulin injection. ? Before and after exercise. ? Before meals. ? 2 hours after a meal. ? Occasionally between 2:00 a.m. and 3:00 a.m., as directed. ? Before potentially dangerous tasks, like driving or using heavy machinery. ? At bedtime.  You may need to check your blood glucose more often, up to 6-10 times a day, if you: ? Use an insulin pump. ? Need multiple daily injections (MDI). ? Have diabetes that is not well-controlled. ? Are ill. ? Have a history of severe hypoglycemia. ? Have hypoglycemia unawareness. If you have type 2 diabetes:  If you take insulin or other diabetes medicines, check your blood glucose 2 or more times a  day.  If you are on intensive insulin therapy, check your blood glucose 4 or more times a day. Occasionally, you may also need to check between 2:00 a.m. and 3:00 a.m., as directed.  Also check your blood glucose: ? Before and after exercise. ? Before potentially dangerous tasks, like driving or using heavy machinery.  You may need to check your blood glucose more often if: ? Your medicine is being adjusted. ? Your diabetes is not well-controlled. ? You are ill. General tips  Always keep your supplies with you.  If you have questions or need help, all blood glucose meters have a 24-hour "hotline" phone number that you can call. You may also contact your health care provider.  After you use a few boxes of test strips, adjust (calibrate) your blood glucose meter by following instructions that came with your meter. Contact a health care provider if:  Your blood glucose is at or above 240 mg/dL (13.3 mmol/L) for 2 days in a row.  You have been sick or have had a fever for 2 days or longer, and you are not getting better.  You have any of the following problems for more than 6 hours: ? You cannot eat or drink. ? You have nausea or vomiting. ? You have diarrhea. Get help right away if:  Your blood glucose is lower than 54 mg/dL (3 mmol/L).  You become confused or you have trouble thinking clearly.  You have difficulty breathing.  You have moderate or large ketone levels in your urine. Summary  Monitoring your blood sugar (glucose) is an important part of managing your diabetes (diabetes mellitus).  Blood glucose monitoring involves checking your blood glucose as often as directed and keeping a record (log) of your results over time.  Your health care provider will set individualized treatment goals for you. Your goals will be based on your age, other medical conditions you have, and how you respond to diabetes treatment.  Every time you check your blood glucose, write down  your result. Also write down any notes about things that may be affecting your blood glucose, such as your diet and exercise for the day. This information is not intended to replace advice given to you by your health care provider. Make sure you discuss any questions you have with your health care provider. Document Revised: 02/05/2018 Document Reviewed: 09/24/2015 Elsevier Patient Education  2020 [place] for Diabetes Mellitus, Adult  Carbohydrate counting is a method of keeping track of how many carbohydrates you eat. Eating carbohydrates naturally increases the amount of sugar (glucose) in the blood. Counting how many carbohydrates you eat helps keep your blood glucose within normal limits, which helps you manage your diabetes (diabetes mellitus). It is important to know how many carbohydrates you can safely have in each meal. This is different for every person. A diet and nutrition specialist (registered dietitian) can help you make a meal plan and calculate how many carbohydrates you should have at each meal and snack. Carbohydrates are found in the following foods:  Grains, such as breads and cereals.  Dried beans and soy products.  Starchy vegetables, such as potatoes, peas, and corn.  Fruit and fruit juices.  Milk and yogurt.  Sweets and snack foods, such as cake, cookies, candy, chips, and soft drinks. How do I count carbohydrates? There are two ways to count carbohydrates in food. You can use either of the methods or a combination of both. Reading "Nutrition Facts" on packaged food The "  Nutrition Facts" list is included on the labels of almost all packaged foods and beverages in the [place] It includes:  The serving size.  Information about nutrients in each serving, including the grams (g) of carbohydrate per serving. To use the "Nutrition Facts":  Decide how many servings you will have.  Multiply the number of servings by the number of  carbohydrates per serving.  The resulting number is the total amount of carbohydrates that you will be having. Learning standard serving sizes of other foods When you eat carbohydrate foods that are not packaged or do not include "Nutrition Facts" on the label, you need to measure the servings in order to count the amount of carbohydrates:  Measure the foods that you will eat with a food scale or measuring cup, if needed.  Decide how many standard-size servings you will eat.  Multiply the number of servings by 15. Most carbohydrate-rich foods have about 15 g of carbohydrates per serving. ? For example, if you eat 8 oz (170 g) of strawberries, you will have eaten 2 servings and 30 g of carbohydrates (2 servings x 15 g = 30 g).  For foods that have more than one food mixed, such as soups and casseroles, you must count the carbohydrates in each food that is included. The following list contains standard serving sizes of common carbohydrate-rich foods. Each of these servings has about 15 g of carbohydrates:   hamburger bun or  English muffin.   oz (15 mL) syrup.   oz (14 g) jelly.  1 slice of bread.  1 six-inch tortilla.  3 oz (85 g) cooked rice or pasta.  4 oz (113 g) cooked dried beans.  4 oz (113 g) starchy vegetable, such as peas, corn, or potatoes.  4 oz (113 g) hot cereal.  4 oz (113 g) mashed potatoes or  of a large baked potato.  4 oz (113 g) canned or frozen fruit.  4 oz (120 mL) fruit juice.  4-6 crackers.  6 chicken nuggets.  6 oz (170 g) unsweetened dry cereal.  6 oz (170 g) plain fat-free yogurt or yogurt sweetened with artificial sweeteners.  8 oz (240 mL) milk.  8 oz (170 g) fresh fruit or one small piece of fruit.  24 oz (680 g) popped popcorn. Example of carbohydrate counting Sample meal  3 oz (85 g) chicken breast.  6 oz (170 g) brown rice.  4 oz (113 g) corn.  8 oz (240 mL) milk.  8 oz (170 g) strawberries with sugar-free whipped  topping. Carbohydrate calculation 1. Identify the foods that contain carbohydrates: ? Rice. ? Corn. ? Milk. ? Strawberries. 2. Calculate how many servings you have of each food: ? 2 servings rice. ? 1 serving corn. ? 1 serving milk. ? 1 serving strawberries. 3. Multiply each number of servings by 15 g: ? 2 servings rice x 15 g = 30 g. ? 1 serving corn x 15 g = 15 g. ? 1 serving milk x 15 g = 15 g. ? 1 serving strawberries x 15 g = 15 g. 4. Add together all of the amounts to find the total grams of carbohydrates eaten: ? 30 g + 15 g + 15 g + 15 g = 75 g of carbohydrates total. Summary  Carbohydrate counting is a method of keeping track of how many carbohydrates you eat.  Eating carbohydrates naturally increases the amount of sugar (glucose) in the blood.  Counting how many carbohydrates you eat helps keep  your blood glucose within normal limits, which helps you manage your diabetes.  A diet and nutrition specialist (registered dietitian) can help you make a meal plan and calculate how many carbohydrates you should have at each meal and snack. This information is not intended to replace advice given to you by your health care provider. Make sure you discuss any questions you have with your health care provider. Document Revised: 11/06/2016 Document Reviewed: 09/26/2015 Elsevier Patient Education  [place].  Hypoglycemia Hypoglycemia is when the sugar (glucose) level in your blood is too low. Signs of low blood sugar may include:  Feeling: ? Hungry. ? Worried or nervous (anxious). ? Sweaty and clammy. ? Confused. ? Dizzy. ? Sleepy. ? Sick to your stomach (nauseous).  Having: ? A fast heartbeat. ? A headache. ? A change in your vision. ? Tingling or no feeling (numbness) around your mouth, lips, or tongue. ? Jerky movements that you cannot control (seizure).  Having trouble with: ? Moving (coordination). ? Sleeping. ? Passing out (fainting). ? Getting  upset easily (irritability). Low blood sugar can happen to people who have diabetes and people who do not have diabetes. Low blood sugar can happen quickly, and it can be an emergency. Treating low blood sugar Low blood sugar is often treated by eating or drinking something sugary right away, such as:  Fruit juice, 4-6 oz (120-150 mL).  Regular soda (not diet soda), 4-6 oz (120-150 mL).  Low-fat milk, 4 oz (120 mL).  Several pieces of hard candy.  Sugar or honey, 1 Tbsp (15 mL). Treating low blood sugar if you have diabetes If you can think clearly and swallow safely, follow the 15:15 rule:  Take 15 grams of a fast-acting carb (carbohydrate). Talk with your doctor about how much you should take.  Always keep a source of fast-acting carb with you, such as: ? Sugar tablets (glucose pills). Take 3-4 pills. ? 6-8 pieces of hard candy. ? 4-6 oz (120-150 mL) of fruit juice. ? 4-6 oz (120-150 mL) of regular (not diet) soda. ? 1 Tbsp (15 mL) honey or sugar.  Check your blood sugar 15 minutes after you take the carb.  If your blood sugar is still at or below 70 mg/dL (3.9 mmol/L), take 15 grams of a carb again.  If your blood sugar does not go above 70 mg/dL (3.9 mmol/L) after 3 tries, get help right away.  After your blood sugar goes back to normal, eat a meal or a snack within 1 hour.  Treating very low blood sugar If your blood sugar is at or below 54 mg/dL (3 mmol/L), you have very low blood sugar (severe hypoglycemia). This may also cause:  Passing out.  Jerky movements you cannot control (seizure).  Losing consciousness (coma). This is an emergency. Do not wait to see if the symptoms will go away. Get medical help right away. Call your local emergency services (911 in the [place] Do not drive yourself to the hospital. If you have very low blood sugar and you cannot eat or drink, you may need a glucagon shot (injection). A family member or friend should learn how to check your  blood sugar and how to give you a glucagon shot. Ask your doctor if you need to have a glucagon shot kit at home. Follow these instructions at home: General instructions  Take over-the-counter and prescription medicines only as told by your doctor.  Stay aware of your blood sugar as told by your doctor.  Limit alcohol intake to no more than 1 drink a day for nonpregnant women and 2 drinks a day for men. One drink equals 12 oz of beer (355 mL), 5 oz of wine (148 mL), or 1 oz of hard liquor (44 mL).  Keep all follow-up visits as told by your doctor. This is important. If you have diabetes:   Follow your diabetes care plan as told by your doctor. Make sure you: ? Know the signs of low blood sugar. ? Take your medicines as told. ? Follow your exercise and meal plan. ? Eat on time. Do not skip meals. ? Check your blood sugar as often as told by your doctor. Always check it before and after exercise. ? Follow your sick day plan when you cannot eat or drink normally. Make this plan ahead of time with your doctor.  Share your diabetes care plan with: ? Your work or school. ? People you live with.  Check your pee (urine) for ketones: ? When you are sick. ? As told by your doctor.  Carry a card or wear jewelry that says you have diabetes. Contact a doctor if:  You have trouble keeping your blood sugar in your target range.  You have low blood sugar often. Get help right away if:  You still have symptoms after you eat or drink something sugary.  Your blood sugar is at or below 54 mg/dL (3 mmol/L).  You have jerky movements that you cannot control.  You pass out. These symptoms may be an emergency. Do not wait to see if the symptoms will go away. Get medical help right away. Call your local emergency services (911 in the [place] Do not drive yourself to the hospital. Summary  Hypoglycemia happens when the level of sugar (glucose) in your blood is too low.  Low blood sugar can  happen to people who have diabetes and people who do not have diabetes. Low blood sugar can happen quickly, and it can be an emergency.  Make sure you know the signs of low blood sugar and know how to treat it.  Always keep a source of sugar (fast-acting carb) with you to treat low blood sugar. This information is not intended to replace advice given to you by your health care provider. Make sure you discuss any questions you have with your health care provider. Document Revised: 08/05/2018 Document Reviewed: 05/18/2015 Elsevier Patient Education  [place].  Insulin Injection Instructions, Using Insulin Pens, Adult A subcutaneous injection is a shot of medicine that is injected into the layer of fat and tissue between skin and muscle. People with type 1 diabetes must take insulin because their bodies do not make it. People with type 2 diabetes may need to take insulin. There are many different types of insulin. The type of insulin that you take may determine how many injections you give yourself and when you need to give the injections. Supplies needed:  Soap and water to wash hands.  Your insulin pen.  A new, unused needle.  Alcohol wipes.  A disposal container that is meant for sharp items (sharps container), such as an empty plastic bottle with a cover. How to choose a site for injection The body absorbs insulin differently, depending on where the insulin is injected (injection site). It is best to inject insulin into the same body area each time (for example, always in the abdomen), but you should use a different spot in that area for each injection. Do not  inject the insulin in the same spot each time. There are five main areas that can be used for injecting. These areas include:  Abdomen. This is the preferred area.  Front of thigh.  Upper, outer side of thigh.  Upper, outer side of arm.  Upper, outer part of buttock. How to use an insulin pen  First, follow the  steps for Get ready, then continue with the steps for Inject the insulin. Get ready 1. Wash your hands with soap and water. If soap and water are not available, use hand sanitizer. 2. Before you give yourself an insulin injection, be sure to test your blood sugar level (blood glucose level) and write down that number. Follow any instructions from your health care provider about what to do if your blood glucose level is higher or lower than your normal range. 3. Check the expiration date and the type of insulin that is in the pen. 4. If you are using CLEAR insulin, check to see that it is clear and free of clumps. 5. If you are using CLOUDY insulin, do not shake the pen to get the injection ready. Instead, get it ready in one of these ways: ? Gently roll the pen between your palms several times. ? Tip the pen up and down several times. 6. Remove the cap from the insulin pen. 7. Use an alcohol wipe to clean the rubber tip of the pen. 8. Remove the protective paper tab from the disposable needle. Do not let the needle touch anything. 9. Screw a new, unused needle onto the pen. 10. Remove the outer plastic needle cover. Do not throw away the outer plastic cover yet. ? If the pen uses a special safety needle, leave the inner needle shield in place. ? If the pen does not use a special safety needle, remove the inner plastic cover from the needle. 11. Follow the manufacturer's instructions to prime the insulin pen with the volume of insulin needed. Hold the pen with the needle pointing up, and push the button on the opposite end of the pen until a drop of insulin appears at the needle tip. If no insulin appears, repeat this step. 12. Turn the button (dial) to the number of units of insulin that you will be injecting. Inject the insulin 1. Use an alcohol wipe to clean the site where you will be injecting the needle. Let the site air-dry. 2. Hold the pen in the palm of your writing hand like a  pencil. 3. If directed by your health care provider, use your other hand to pinch and hold about an inch (2.5 cm) of skin at the injection site. Do not directly touch the cleaned part of the skin. 4. Gently but quickly, use your writing hand to put the needle straight into the skin. The needle should be at a 90-degree angle (perpendicular) to the skin. 5. When the needle is completely inserted into the skin, use your thumb or index finger of your writing hand to push the top button of the pen down all the way to inject the insulin. 6. Let go of the skin that you are pinching. Continue to hold the pen in place with your writing hand. 7. Wait 10 seconds, then pull the needle straight out of the skin. This will allow all of the insulin to go from the pen and needle into your body. 8. Carefully put the larger (outer) plastic cover of the needle back over the needle, then unscrew the capped needle  and discard it in a sharps container, such as an empty plastic bottle with a cover. 9. Put the plastic cap back on the insulin pen. How to throw away supplies  Discard all used needles in a puncture-proof sharps disposal container. You can ask your local pharmacy about where you can get this kind of disposal container, or you can use an empty plastic liquid laundry detergent bottle that has a cover.  Follow the disposal regulations for the area where you live. Do not use any needle more than one time.  Throw away empty disposable pens in the regular trash. Questions to ask your health care provider  How often should I be taking insulin?  How often should I check my blood glucose?  What amount of insulin should I be taking at each time?  What are the side effects?  What should I do if my blood glucose is too high?  What should I do if my blood glucose is too low?  What should I do if I forget to take my insulin?  What number should I call if I have questions? Where to find more  information  American Diabetes Association (ADA): www.diabetes.org  American Association of Diabetes Educators (AADE) Patient Resources: https://www.diabeteseducator.org Summary  A subcutaneous injection is a shot of medicine that is injected into the layer of fat and tissue between skin and muscle.  Before you give yourself an insulin injection, be sure to test your blood sugar level (blood glucose level) and write down that number.  Check the expiration date and the type of insulin that is in the pen. The type of insulin that you take may determine how many injections you give yourself and when you need to give the injections.  It is best to inject insulin into the same body area each time (for example, always in the abdomen), but you should use a different spot in that area for each injection. This information is not intended to replace advice given to you by your health care provider. Make sure you discuss any questions you have with your health care provider. Document Revised: 05/04/2017 Document Reviewed: 05/18/2015 Elsevier Patient Education  2020 [place] With Diabetes Diabetes (type 1 diabetes mellitus or type 2 diabetes mellitus) is a condition in which the body does not have enough of a hormone called insulin, or the body does not respond properly to insulin. Normally, insulin allows sugars (glucose) to enter cells in the body. The cells use glucose for energy. With diabetes, extra glucose builds up in the blood instead of going into cells, which results in high blood glucose (hyperglycemia). How to manage lifestyle changes Managing diabetes includes medical treatments as well as lifestyle changes. If diabetes is not managed well, serious physical and emotional complications can occur. Taking good care of yourself means that you are responsible for:  Monitoring glucose regularly.  Eating a healthy diet.  Exercising regularly.  Meeting with health care  providers.  Taking medicines as directed. Some people may feel a lot of stress about managing their diabetes. This is known as emotional distress, and it is very common. Living with diabetes can place you at risk for emotional distress, depression, or anxiety. These disorders can be confusing and can make diabetes management more difficult. How to recognize stress Emotional distress Symptoms of emotional distress include:  Anger about having a diagnosis of diabetes.  Fear or frustration about your diagnosis and the changes you need to make to manage the condition.  Being overly worried about the care that you need or the cost of the care that you need.  Feeling like you caused your condition by doing something wrong.  Fear of unpredictable situations, like low or high blood glucose.  Feeling judged by your health care providers.  Feeling very alone with the disease.  Getting too tired or worn out with the demands of daily care. Depression Having diabetes means that you are at a higher risk for depression. Having depression also means that you are at a higher risk for diabetes. Your health care provider may test (screen) you for symptoms of depression. It is important to recognize depression symptoms and to start treatment for depression soon after it is diagnosed. The following are some symptoms of depression:  Loss of interest in things that you used to enjoy.  Trouble sleeping, or often waking up early and not being able to get back to sleep.  A change in appetite.  Feeling tired most of the day.  Feeling nervous and anxious.  Feeling guilty and worrying that you are a burden to others.  Feeling depressed more often than you do not feel that way.  Thoughts of hurting yourself or feeling that you want to die. If you have any of these symptoms for 2 weeks or longer, reach out to a health care provider. Follow these instructions at home: Managing emotional distress The  following are some ways to manage emotional distress:  Talk with your health care provider or certified diabetes educator. Consider working with a counselor or therapist.  Learn as much as you can about diabetes and its treatment. Meet with a certified diabetes educator or take a class to learn how to manage your condition.  Keep a journal of your thoughts and concerns.  Accept that some things are out of your control.  Talk with other people who have diabetes. It can help to talk with others about the emotional distress that you feel.  Find ways to manage stress that work for you. These may include art or music therapy, exercise, meditation, and hobbies.  Seek support from spiritual leaders, family, and friends. General instructions  Follow your diabetes management plan.  Keep all follow-up visits as told by your health care provider. This is important. Where to find support   Ask your health care provider to recommend a therapist who understands both depression and diabetes.  Search for information and support from the American Diabetes Association: www.diabetes.org  Find a certified diabetes educator and make an appointment through [place] of Diabetes Educators: www.diabeteseducator.org Get help right away if:  You have thoughts about hurting yourself or others. If you ever feel like you may hurt yourself or others, or have thoughts about taking your own life, get help right away. You can go to your nearest emergency department or call:  Your local emergency services (911 in the [place]  A suicide crisis helpline, such as the [place] at (254) 519-9328. This is open 24 hours a day. Summary  Diabetes (type 1 diabetes mellitus or type 2 diabetes mellitus) is a condition in which the body does not have enough of a hormone called insulin, or the body does not respond properly to insulin.  Living with diabetes puts you at risk for medical  issues, and it also puts you at risk for emotional issues such as emotional distress, depression, and anxiety.  Recognizing the symptoms of emotional distress and depression may help you avoid problems with your diabetes  control. It is important to start treatment for emotional distress and depression soon after they are diagnosed.  Having diabetes means that you are at a higher risk for depression. Ask your health care provider to recommend a therapist who understands both depression and diabetes.  If you experience symptoms of emotional distress or depression, it is important to discuss this with your health care provider, certified diabetes educator, or therapist. This information is not intended to replace advice given to you by your health care provider. Make sure you discuss any questions you have with your health care provider. Document Revised: 04/26/2018 Document Reviewed: 08/28/2016 Elsevier Patient Education  [place].  Type 2 Diabetes Mellitus, Self Care, Adult When you have type 2 diabetes (type 2 diabetes mellitus), you must make sure your blood sugar (glucose) stays in a healthy range. You can do this with:  Nutrition.  Exercise.  Lifestyle changes.  Medicines or insulin, if needed.  Support from your doctors and others. How to stay aware of blood sugar   Check your blood sugar level every day, as often as told.  Have your A1c (hemoglobin A1c) level checked two or more times a year. Have it checked more often if your doctor tells you to. Your doctor will set personal treatment goals for you. Generally, you should have these blood sugar levels:  Before meals (preprandial): 80-130 mg/dL (4.4-7.2 mmol/L).  After meals (postprandial): below 180 mg/dL (10 mmol/L).  A1c level: less than 7%. How to manage high and low blood sugar Signs of high blood sugar High blood sugar is called hyperglycemia. Know the signs of high blood sugar. Signs may  include:  Feeling: ? Thirsty. ? Hungry. ? Very tired.  Needing to pee (urinate) more than usual.  Blurry vision. Signs of low blood sugar Low blood sugar is called hypoglycemia. This is when blood sugar is at or below 70 mg/dL (3.9 mmol/L). Signs may include:  Feeling: ? Hungry. ? Worried or nervous (anxious). ? Sweaty and clammy. ? Confused. ? Dizzy. ? Sleepy. ? Sick to your stomach (nauseous).  Having: ? A fast heartbeat. ? A headache. ? A change in your vision. ? Jerky movements that you cannot control (seizure). ? Tingling or no feeling (numbness) around your mouth, lips, or tongue.  Having trouble with: ? Moving (coordination). ? Sleeping. ? Passing out (fainting). ? Getting upset easily (irritability). Treating low blood sugar To treat low blood sugar, eat or drink something sugary right away. If you can think clearly and swallow safely, follow the 15:15 rule:  Take 15 grams of a fast-acting carb (carbohydrate). Talk with your doctor about how much you should take.  Some fast-acting carbs are: ? Sugar tablets (glucose pills). Take 3-4 pills. ? 6-8 pieces of hard candy. ? 4-6 oz (120-150 mL) of fruit juice. ? 4-6 oz (120-150 mL) of regular (not diet) soda. ? 1 Tbsp (15 mL) honey or sugar.  Check your blood sugar 15 minutes after you take the carb.  If your blood sugar is still at or below 70 mg/dL (3.9 mmol/L), take 15 grams of a carb again.  If your blood sugar does not go above 70 mg/dL (3.9 mmol/L) after 3 tries, get help right away.  After your blood sugar goes back to normal, eat a meal or a snack within 1 hour. Treating very low blood sugar If your blood sugar is at or below 54 mg/dL (3 mmol/L), you have very low blood sugar (severe hypoglycemia). This is an  emergency. Do not wait to see if the symptoms will go away. Get medical help right away. Call your local emergency services (911 in the [place] If you have very low blood sugar and you cannot eat  or drink, you may need a glucagon shot (injection). A family member or friend should learn how to check your blood sugar and how to give you a glucagon shot. Ask your doctor if you need to have a glucagon shot kit at home. Follow these instructions at home: Medicine  Take insulin and diabetes medicines as told.  If your doctor says you should take more or less insulin and medicines, do this exactly as told.  Do not run out of insulin or medicines. Having diabetes can raise your risk for other long-term conditions. These include heart disease and kidney disease. Your doctor may prescribe medicines to help you not have these problems. Food   Make healthy food choices. These include: ? Chicken, fish, egg whites, and beans. ? Oats, whole wheat, bulgur, brown rice, quinoa, and millet. ? Fresh fruits and vegetables. ? Low-fat dairy products. ? Nuts, avocado, olive oil, and canola oil.  Meet with a food specialist (dietitian). He or she can help you make an eating plan that is right for you.  Follow instructions from your doctor about what you cannot eat or drink.  Drink enough fluid to keep your pee (urine) pale yellow.  Keep track of carbs that you eat. Do this by reading food labels and learning food serving sizes.  Follow your sick day plan when you cannot eat or drink normally. Make this plan with your doctor so it is ready to use. Activity  Exercise 3 or more times a week.  Do not go more than 2 days without exercising.  Talk with your doctor before you start a new exercise. Your doctor may need to tell you to change: ? How much insulin or medicines you take. ? How much food you eat. Lifestyle  Do not use any tobacco products. These include cigarettes, chewing tobacco, and e-cigarettes. If you need help quitting, ask your doctor.  Ask your doctor how much alcohol is safe for you.  Learn to deal with stress. If you need help with this, ask your doctor. Body care   Stay  up to date with your shots (immunizations).  Have your eyes and feet checked by a doctor as often as told.  Check your skin and feet every day. Check for cuts, bruises, redness, blisters, or sores.  Brush your teeth and gums two times a day. Floss one or more times a day.  Go to the dentist one or more times every 6 months.  Stay at a healthy weight. General instructions  Take over-the-counter and prescription medicines only as told by your doctor.  Share your diabetes care plan with: ? Your work or school. ? People you live with.  Carry a card or wear jewelry that says you have diabetes.  Keep all follow-up visits as told by your doctor. This is important. Questions to ask your doctor  Do I need to meet with a diabetes educator?  Where can I find a support group for people with diabetes? Where to find more information To learn more about diabetes, visit:  American Diabetes Association: www.diabetes.org  American Association of Diabetes Educators: www.diabeteseducator.org Summary  When you have type 2 diabetes, you must make sure your blood sugar (glucose) stays in a healthy range.  Check your blood sugar every day,  as often as told.  Having diabetes can raise your risk for other conditions. Your doctor may prescribe medicines to help you not have these problems.  Keep all follow-up visits as told by your doctor. This is important. This information is not intended to replace advice given to you by your health care provider. Make sure you discuss any questions you have with your health care provider. Document Revised: 10/05/2017 Document Reviewed: 05/18/2015 Elsevier Patient Education  2020 [employer].

## 2020-04-30 NOTE — Patient Outreach (Signed)
[place] [facility]) Care Management  [place]  04/30/2020   Miguel Boone 07/19/1950 [id number]  Subjective: Telephone call to patient for follow up hospitalization. He reports he is doing good. Blood sugars ranging 200-400. Patient only taking insulin once a day instead of prescribed twice a day. Patient states he was not sure of dosage.   Reviewed discharge instructions with patient and advised him to take medication twice a day as prescribed.  Patient verbalized understanding and will take insulin twice a day.  He states he has already called PCP for follow up appointment and waiting for return call.  Discussed with patient hypoglycemia and steps to take.  He verbalized understanding. Patient states that he lives alone but has support of siblings.  His brother will be taking him to appointments right now as his vision is blurry.  Discussed diet and diet adherence importance as well.   Discussed with patient [facility] support and services. Patient consents to CM calls for education and support with his diabetes.    Objective:   Encounter Medications:  Outpatient Encounter Medications as of 04/30/2020  Medication Sig  . glucose blood (FREESTYLE TEST STRIPS) test strip Check glucoses twice daily. Once in the morning before breakfast and once in the evening before dinner.  . insulin aspart protamine - aspart (NOVOLOG 70/30 MIX) (70-30) 100 UNIT/ML FlexPen Inject 0.6 mLs (60 Units total) into the skin daily with breakfast.  . Lancets (FREESTYLE) lancets Check glucoses twice daily. Once in the morning before breakfast and once in the evening before dinner.  . insulin aspart protamine - aspart (NOVOLOG 70/30 MIX) (70-30) 100 UNIT/ML FlexPen Inject 0.5 mLs (50 Units total) into the skin daily with supper. (Patient not taking: Reported on 04/30/2020)  . Insulin Pen Needle (PEN NEEDLES) 31G X 5 MM MISC 1 each by Does not apply route in the morning and at bedtime.   No  facility-administered encounter medications on file as of 04/30/2020.    Functional Status:  In your present state of health, do you have any difficulty performing the following activities: 04/30/2020  Hearing? N  Vision? Y  Comment blurry  Difficulty concentrating or making decisions? N  Walking or climbing stairs? N  Dressing or bathing? N  Doing errands, shopping? N  Preparing Food and eating ? N  Using the Toilet? N  In the past six months, have you accidently leaked urine? N  Do you have problems with loss of bowel control? N  Managing your Medications? N  Managing your Finances? N  Housekeeping or managing your Housekeeping? N  Some recent data might be hidden    Fall/Depression Screening: Fall Risk  04/30/2020  Falls in the past year? 0  Number falls in past yr: 0  Injury with Fall? 0   PHQ 2/9 Scores 04/30/2020  PHQ - 2 Score 0    Assessment:  Patient with newly diagnosed diabetes per patient. He is not taking insulin as prescribed but states he will start to take as prescribed.   Goals Addressed            This Visit's Progress   . Medication Adherence Maintained       Timeframe:  Short-Term Goal Priority:  High Start Date:      04/30/20                       Expected End Date:   06/25/20 Follow up date: 06/25/20  Educated patient on importance of taking medications as prescribed.      Nelida Meuse and Keep All Appointments       Timeframe:  Long-Range Goal Priority:  High Start Date:   04/30/20                          Expected End Date:   06/25/20                    Follow Up Date 06/25/20    - ask family or friend for a ride - keep a calendar with appointment dates    Why is this important?    Part of staying healthy is seeing the doctor for follow-up care.   If you forget your appointments, there are some things you can do to stay on track.    Notes:     . THN-Monitor and Manage My Blood Sugar-Diabetes Type 2       Timeframe:   Long-Range Goal Priority:  High Start Date:  04/30/20                           Expected End Date:    06/25/20                   Follow Up Date 06/25/20   - check blood sugar at prescribed times - check blood sugar if I feel it is too high or too low - enter blood sugar readings and medication or insulin into daily log - take the blood sugar log to all doctor visits    Why is this important?    Checking your blood sugar at home helps to keep it from getting very high or very low.   Writing the results in a diary or log helps the doctor know how to care for you.   Your blood sugar log should have the time, date and the results.   Also, write down the amount of insulin or other medicine that you take.   Other information, like what you ate, exercise done and how you were feeling, will also be helpful.     Notes:     . THN-Obtain Eye Exam-Diabetes Type 2       Timeframe:  Short-Term Goal Priority:  High Start Date:    04/30/20                         Expected End Date:   06/25/20                   Follow Up Date 06/25/20   - schedule appointment with eye doctor    Why is this important?    Eye check-ups are important when you have diabetes.   Vision loss can be prevented.    Notes: Encouraged patient to schedule an appointment with eye doctor.       Plan: RN CM will provide ongoing education and support to patient through phone calls.   RN CM will send welcome packet with consent to patient.   RN CM will send initial barriers letter, assessment, and care plan to primary care physician.   RN CM will contact patient again this month and patient agrees to next contact.   Follow-up:  Patient agrees to Care Plan and Follow-up.   Bary Leriche, RN, MSN [facility] Care Management Care Management Coordinator Direct  Line [phone] Cell [phone] Toll Free: [phone]  Fax: [phone]

## 2020-05-01 DIAGNOSIS — E1165 Type 2 diabetes mellitus with hyperglycemia: Secondary | ICD-10-CM | POA: Diagnosis not present

## 2020-05-01 DIAGNOSIS — N182 Chronic kidney disease, stage 2 (mild): Secondary | ICD-10-CM | POA: Diagnosis not present

## 2020-05-03 DIAGNOSIS — E875 Hyperkalemia: Secondary | ICD-10-CM | POA: Diagnosis not present

## 2020-05-03 DIAGNOSIS — I248 Other forms of acute ischemic heart disease: Secondary | ICD-10-CM | POA: Diagnosis not present

## 2020-05-03 DIAGNOSIS — E1110 Type 2 diabetes mellitus with ketoacidosis without coma: Secondary | ICD-10-CM | POA: Diagnosis not present

## 2020-05-03 DIAGNOSIS — E782 Mixed hyperlipidemia: Secondary | ICD-10-CM | POA: Diagnosis not present

## 2020-05-03 DIAGNOSIS — N400 Enlarged prostate without lower urinary tract symptoms: Secondary | ICD-10-CM | POA: Diagnosis not present

## 2020-05-03 DIAGNOSIS — N179 Acute kidney failure, unspecified: Secondary | ICD-10-CM | POA: Diagnosis not present

## 2020-05-03 DIAGNOSIS — I10 Essential (primary) hypertension: Secondary | ICD-10-CM | POA: Diagnosis not present

## 2020-05-03 DIAGNOSIS — R000 Tachycardia, unspecified: Secondary | ICD-10-CM | POA: Diagnosis not present

## 2020-05-03 DIAGNOSIS — D573 Sickle-cell trait: Secondary | ICD-10-CM | POA: Diagnosis not present

## 2020-05-08 DIAGNOSIS — N179 Acute kidney failure, unspecified: Secondary | ICD-10-CM | POA: Diagnosis not present

## 2020-05-08 DIAGNOSIS — E875 Hyperkalemia: Secondary | ICD-10-CM | POA: Diagnosis not present

## 2020-05-08 DIAGNOSIS — I248 Other forms of acute ischemic heart disease: Secondary | ICD-10-CM | POA: Diagnosis not present

## 2020-05-08 DIAGNOSIS — E1110 Type 2 diabetes mellitus with ketoacidosis without coma: Secondary | ICD-10-CM | POA: Diagnosis not present

## 2020-05-09 DIAGNOSIS — N179 Acute kidney failure, unspecified: Secondary | ICD-10-CM | POA: Diagnosis not present

## 2020-05-09 DIAGNOSIS — I10 Essential (primary) hypertension: Secondary | ICD-10-CM | POA: Diagnosis not present

## 2020-05-09 DIAGNOSIS — N400 Enlarged prostate without lower urinary tract symptoms: Secondary | ICD-10-CM | POA: Diagnosis not present

## 2020-05-09 DIAGNOSIS — E1110 Type 2 diabetes mellitus with ketoacidosis without coma: Secondary | ICD-10-CM | POA: Diagnosis not present

## 2020-05-09 DIAGNOSIS — E875 Hyperkalemia: Secondary | ICD-10-CM | POA: Diagnosis not present

## 2020-05-09 DIAGNOSIS — I248 Other forms of acute ischemic heart disease: Secondary | ICD-10-CM | POA: Diagnosis not present

## 2020-05-09 DIAGNOSIS — D573 Sickle-cell trait: Secondary | ICD-10-CM | POA: Diagnosis not present

## 2020-05-09 DIAGNOSIS — R000 Tachycardia, unspecified: Secondary | ICD-10-CM | POA: Diagnosis not present

## 2020-05-09 DIAGNOSIS — E782 Mixed hyperlipidemia: Secondary | ICD-10-CM | POA: Diagnosis not present

## 2020-05-10 DIAGNOSIS — E782 Mixed hyperlipidemia: Secondary | ICD-10-CM | POA: Diagnosis not present

## 2020-05-10 DIAGNOSIS — E119 Type 2 diabetes mellitus without complications: Secondary | ICD-10-CM | POA: Diagnosis not present

## 2020-05-10 DIAGNOSIS — I129 Hypertensive chronic kidney disease with stage 1 through stage 4 chronic kidney disease, or unspecified chronic kidney disease: Secondary | ICD-10-CM | POA: Diagnosis not present

## 2020-05-10 DIAGNOSIS — N182 Chronic kidney disease, stage 2 (mild): Secondary | ICD-10-CM | POA: Diagnosis not present

## 2020-05-14 ENCOUNTER — Other Ambulatory Visit: Payer: Self-pay

## 2020-05-14 NOTE — Patient Instructions (Signed)
Goals Addressed            This Visit's Progress   . Medication Adherence Maintained   On track    Timeframe:  Short-Term Goal Priority:  High Start Date:      04/30/20                       Expected End Date:   06/25/20 Follow up date: 06/25/20                     Educated patient on importance of taking medications as prescribed.      Linward Headland and Keep All Appointments   On track    Timeframe:  Long-Range Goal Priority:  High Start Date:   04/30/20                          Expected End Date:   06/25/20                    Follow Up Date 06/25/20    - ask family or friend for a ride - keep a calendar with appointment dates    Why is this important?    Part of staying healthy is seeing the doctor for follow-up care.   If you forget your appointments, there are some things you can do to stay on track.    Notes:     . THN-Monitor and Manage My Blood Sugar-Diabetes Type 2   On track    Timeframe:  Long-Range Goal Priority:  High Start Date:  04/30/20                           Expected End Date:    06/25/20                   Follow Up Date 06/25/20   - check blood sugar at prescribed times - check blood sugar if I feel it is too high or too low - enter blood sugar readings and medication or insulin into daily log - take the blood sugar log to all doctor visits    Why is this important?    Checking your blood sugar at home helps to keep it from getting very high or very low.   Writing the results in a diary or log helps the doctor know how to care for you.   Your blood sugar log should have the time, date and the results.   Also, write down the amount of insulin or other medicine that you take.   Other information, like what you ate, exercise done and how you were feeling, will also be helpful.     Notes:     . THN-Obtain Eye Exam-Diabetes Type 2   On track    Timeframe:  Short-Term Goal Priority:  High Start Date:    04/30/20                         Expected End Date:    06/25/20                   Follow Up Date 06/25/20   - schedule appointment with eye doctor    Why is this important?    Eye check-ups are important when you have diabetes.   Vision loss can be prevented.    Notes:  Encouraged patient to schedule an appointment with eye doctor.

## 2020-05-14 NOTE — Patient Outreach (Signed)
[facility]) Care Management  [place]  05/14/2020   Miguel Boone [birth year] [id number]   Subjective: Telephone call to patient for follow up. Patient reports he is doing good. He reports his blood sugars have been good.  BS ranging 65-126. Discussed low blood sugar control.  [facility] Nurse coming by 2 day per week, then 1 time per week.  He is doing good with his diet and measuring his food.  Encouraged continued blood sugar control.  Patient verbalized understanding.   Objective:   Encounter Medications:  Outpatient Encounter Medications as of 05/14/2020  Medication Sig  . glucose blood (FREESTYLE TEST STRIPS) test strip Check glucoses twice daily. Once in the morning before breakfast and once in the evening before dinner.  . insulin aspart protamine - aspart (NOVOLOG 70/30 MIX) (70-30) 100 UNIT/ML FlexPen Inject 0.6 mLs (60 Units total) into the skin daily with breakfast.  . insulin aspart protamine - aspart (NOVOLOG 70/30 MIX) (70-30) 100 UNIT/ML FlexPen Inject 0.5 mLs (50 Units total) into the skin daily with supper. (Patient not taking: Reported on 04/30/2020)  . Insulin Pen Needle (PEN NEEDLES) 31G X 5 MM MISC 1 each by Does not apply route in the morning and at bedtime.  . Lancets (FREESTYLE) lancets Check glucoses twice daily. Once in the morning before breakfast and once in the evening before dinner.   No facility-administered encounter medications on file as of 05/14/2020.    Functional Status:  In your present state of health, do you have any difficulty performing the following activities: 04/30/2020  Hearing? N  Vision? Y  Comment blurry  Difficulty concentrating or making decisions? N  Walking or climbing stairs? N  Dressing or bathing? N  Doing errands, shopping? N  Preparing Food and eating ? N  Using the Toilet? N  In the past six months, have you accidently leaked urine? N  Do you have problems with loss of bowel control? N   Managing your Medications? N  Managing your Finances? N  Housekeeping or managing your Housekeeping? N  Some recent data might be hidden    Fall/Depression Screening: Fall Risk  04/30/2020  Falls in the past year? 0  Number falls in past yr: 0  Injury with Fall? 0   PHQ 2/9 Scores 04/30/2020  PHQ - 2 Score 0    Assessment:  Patient managing well with new diabetes regimen.  Home health involved as well.  Goals Addressed            This Visit's Progress   . Medication Adherence Maintained   On track    Timeframe:  Short-Term Goal Priority:  High Start Date:      04/30/20                       Expected End Date:   06/25/20 Follow up date: 06/25/20                     Educated patient on importance of taking medications as prescribed.      [facility] [place] and Keep All Appointments   On track    Timeframe:  Long-Range Goal Priority:  High Start Date:   04/30/20                          Expected End Date:   06/25/20  Follow Up Date 06/25/20    - ask family or friend for a ride - keep a calendar with appointment dates    Why is this important?    Part of staying healthy is seeing the doctor for follow-up care.   If you forget your appointments, there are some things you can do to stay on track.    Notes:     . THN-Monitor and Manage My Blood Sugar-Diabetes Type 2   On track    Timeframe:  Long-Range Goal Priority:  High Start Date:  04/30/20                           Expected End Date:    06/25/20                   Follow Up Date 06/25/20   - check blood sugar at prescribed times - check blood sugar if I feel it is too high or too low - enter blood sugar readings and medication or insulin into daily log - take the blood sugar log to all doctor visits    Why is this important?    Checking your blood sugar at home helps to keep it from getting very high or very low.   Writing the results in a diary or log helps the doctor know how to care for you.   Your  blood sugar log should have the time, date and the results.   Also, write down the amount of insulin or other medicine that you take.   Other information, like what you ate, exercise done and how you were feeling, will also be helpful.     Notes:     . THN-Obtain Eye Exam-Diabetes Type 2   On track    Timeframe:  Short-Term Goal Priority:  High Start Date:    04/30/20                         Expected End Date:   06/25/20                   Follow Up Date 06/25/20   - schedule appointment with eye doctor    Why is this important?    Eye check-ups are important when you have diabetes.   Vision loss can be prevented.    Notes: Encouraged patient to schedule an appointment with eye doctor.       Plan: RN CM will contact patient next month and patient agreeable.  Follow-up:  Patient agrees to Care Plan and Follow-up.   Jone Baseman, RN, MSN [place] Management Care Management Coordinator Direct Line [phone] Cell [phone] Toll Free: [phone]  Fax: [phone]

## 2020-05-24 DIAGNOSIS — N400 Enlarged prostate without lower urinary tract symptoms: Secondary | ICD-10-CM | POA: Diagnosis not present

## 2020-05-24 DIAGNOSIS — R000 Tachycardia, unspecified: Secondary | ICD-10-CM | POA: Diagnosis not present

## 2020-05-24 DIAGNOSIS — E875 Hyperkalemia: Secondary | ICD-10-CM | POA: Diagnosis not present

## 2020-05-24 DIAGNOSIS — N179 Acute kidney failure, unspecified: Secondary | ICD-10-CM | POA: Diagnosis not present

## 2020-05-24 DIAGNOSIS — E1110 Type 2 diabetes mellitus with ketoacidosis without coma: Secondary | ICD-10-CM | POA: Diagnosis not present

## 2020-05-24 DIAGNOSIS — I248 Other forms of acute ischemic heart disease: Secondary | ICD-10-CM | POA: Diagnosis not present

## 2020-05-24 DIAGNOSIS — D573 Sickle-cell trait: Secondary | ICD-10-CM | POA: Diagnosis not present

## 2020-05-24 DIAGNOSIS — E782 Mixed hyperlipidemia: Secondary | ICD-10-CM | POA: Diagnosis not present

## 2020-05-24 DIAGNOSIS — I10 Essential (primary) hypertension: Secondary | ICD-10-CM | POA: Diagnosis not present

## 2020-05-28 DIAGNOSIS — D573 Sickle-cell trait: Secondary | ICD-10-CM | POA: Diagnosis not present

## 2020-05-28 DIAGNOSIS — R000 Tachycardia, unspecified: Secondary | ICD-10-CM | POA: Diagnosis not present

## 2020-05-28 DIAGNOSIS — N400 Enlarged prostate without lower urinary tract symptoms: Secondary | ICD-10-CM | POA: Diagnosis not present

## 2020-05-28 DIAGNOSIS — N179 Acute kidney failure, unspecified: Secondary | ICD-10-CM | POA: Diagnosis not present

## 2020-05-28 DIAGNOSIS — E875 Hyperkalemia: Secondary | ICD-10-CM | POA: Diagnosis not present

## 2020-05-28 DIAGNOSIS — I10 Essential (primary) hypertension: Secondary | ICD-10-CM | POA: Diagnosis not present

## 2020-05-28 DIAGNOSIS — I248 Other forms of acute ischemic heart disease: Secondary | ICD-10-CM | POA: Diagnosis not present

## 2020-05-28 DIAGNOSIS — E782 Mixed hyperlipidemia: Secondary | ICD-10-CM | POA: Diagnosis not present

## 2020-05-28 DIAGNOSIS — E1110 Type 2 diabetes mellitus with ketoacidosis without coma: Secondary | ICD-10-CM | POA: Diagnosis not present

## 2020-05-30 DIAGNOSIS — E1165 Type 2 diabetes mellitus with hyperglycemia: Secondary | ICD-10-CM | POA: Diagnosis not present

## 2020-05-31 ENCOUNTER — Other Ambulatory Visit: Payer: Self-pay

## 2020-05-31 DIAGNOSIS — E1122 Type 2 diabetes mellitus with diabetic chronic kidney disease: Secondary | ICD-10-CM | POA: Diagnosis not present

## 2020-05-31 DIAGNOSIS — E118 Type 2 diabetes mellitus with unspecified complications: Secondary | ICD-10-CM | POA: Diagnosis not present

## 2020-05-31 NOTE — Patient Outreach (Signed)
[place] [facility]) Care Management  05/31/2020  Miguel Boone 22-Mar-1951 [id number]   Telephone call to patient for follow up.  No answer.  HIPAA compliant message left.  Plan: RN CM will attempt again within the next 4 days and send letter.  Jone Baseman, RN, MSN [place] Management Care Management Coordinator Direct Line [phone] Cell [phone] Toll Free: [phone]  Fax: [phone]

## 2020-05-31 NOTE — Patient Outreach (Signed)
[facility]) Care Management  [place]  05/31/2020   Miguel Boone 1950/06/26 [id number]  Subjective: Return call from patient. He is doing well weight down 13 lbs. Sugar range 97-123.  Discussed sugar control and also reiterated eye appointment.  He verbalized understanding.     Objective:   Encounter Medications:  Outpatient Encounter Medications as of 05/31/2020  Medication Sig  . glucose blood (FREESTYLE TEST STRIPS) test strip Check glucoses twice daily. Once in the morning before breakfast and once in the evening before dinner.  . insulin aspart protamine - aspart (NOVOLOG 70/30 MIX) (70-30) 100 UNIT/ML FlexPen Inject 0.6 mLs (60 Units total) into the skin daily with breakfast.  . insulin aspart protamine - aspart (NOVOLOG 70/30 MIX) (70-30) 100 UNIT/ML FlexPen Inject 0.5 mLs (50 Units total) into the skin daily with supper. (Patient not taking: Reported on 04/30/2020)  . Insulin Pen Needle (PEN NEEDLES) 31G X 5 MM MISC 1 each by Does not apply route in the morning and at bedtime.  . Lancets (FREESTYLE) lancets Check glucoses twice daily. Once in the morning before breakfast and once in the evening before dinner.   No facility-administered encounter medications on file as of 05/31/2020.    Functional Status:  In your present state of health, do you have any difficulty performing the following activities: 04/30/2020  Hearing? N  Vision? Y  Comment blurry  Difficulty concentrating or making decisions? N  Walking or climbing stairs? N  Dressing or bathing? N  Doing errands, shopping? N  Preparing Food and eating ? N  Using the Toilet? N  In the past six months, have you accidently leaked urine? N  Do you have problems with loss of bowel control? N  Managing your Medications? N  Managing your Finances? N  Housekeeping or managing your Housekeeping? N  Some recent data might be hidden    Fall/Depression Screening: Fall Risk  04/30/2020  Falls in the  past year? 0  Number falls in past yr: 0  Injury with Fall? 0   PHQ 2/9 Scores 04/30/2020  PHQ - 2 Score 0    Assessment:  Goals Addressed            This Visit's Progress   . COMPLETED: Medication Adherence Maintained       Timeframe:  Short-Term Goal Priority:  High Start Date:      04/30/20                       Expected End Date:   06/25/20 Follow up date: 06/25/20                     Educated patient on importance of taking medications as prescribed.   05/31/20 Patient taking medication as prescribed.      Lin[facility] and Keep All Appointments   On track    Timeframe:  Long-Range Goal Priority:  High Start Date:   04/30/20                          Expected End Date:   09/25/20                Follow Up Date 07/26/20   - arrange a ride through an agency 1 week before appointment    Why is this important?    Part of staying healthy is seeing the doctor for follow-up care.   If you  forget your appointments, there are some things you can do to stay on track.    Notes: 05/31/20 Patient seeing physicians regularly.      . THN-Monitor and Manage My Blood Sugar-Diabetes Type 2   On track    Timeframe:  Long-Range Goal Priority:  High Start Date:  04/30/20                           Expected End Date:    12/26/20             Follow Up Date 07/26/20   - enter blood sugar readings and medication or insulin into daily log - take the blood sugar log to all doctor visits    Why is this important?    Checking your blood sugar at home helps to keep it from getting very high or very low.   Writing the results in a diary or log helps the doctor know how to care for you.   Your blood sugar log should have the time, date and the results.   Also, write down the amount of insulin or other medicine that you take.   Other information, like what you ate, exercise done and how you were feeling, will also be helpful.     Notes: 05/31/20 Patient sugars ranging 97-123.  Lost 13 lbs.    [facility] Eye Exam-Diabetes Type 2   Not on track    Timeframe:  Short-Term Goal Priority:  High Start Date:    04/30/20                         Expected End Date:   07/26/20                  Follow Up Date 3/31//22   - schedule appointment with eye doctor    Why is this important?    Eye check-ups are important when you have diabetes.   Vision loss can be prevented.    Notes: Encouraged patient to schedule an appointment with eye doctor. 05/31/20 Patient has not called eye doctor.        Plan: RN CM will contact next month. Follow-up:  Patient agrees to Care Plan and Follow-up.   Jone Baseman, RN, MSN [place] Management Care Management Coordinator Direct Line [phone] Cell [phone] Toll Free: [phone]  Fax: [phone]

## 2020-06-05 DIAGNOSIS — E1165 Type 2 diabetes mellitus with hyperglycemia: Secondary | ICD-10-CM | POA: Diagnosis not present

## 2020-06-05 DIAGNOSIS — Z794 Long term (current) use of insulin: Secondary | ICD-10-CM | POA: Diagnosis not present

## 2020-06-07 DIAGNOSIS — E1110 Type 2 diabetes mellitus with ketoacidosis without coma: Secondary | ICD-10-CM | POA: Diagnosis not present

## 2020-06-07 DIAGNOSIS — I248 Other forms of acute ischemic heart disease: Secondary | ICD-10-CM | POA: Diagnosis not present

## 2020-06-07 DIAGNOSIS — N400 Enlarged prostate without lower urinary tract symptoms: Secondary | ICD-10-CM | POA: Diagnosis not present

## 2020-06-07 DIAGNOSIS — D573 Sickle-cell trait: Secondary | ICD-10-CM | POA: Diagnosis not present

## 2020-06-07 DIAGNOSIS — R000 Tachycardia, unspecified: Secondary | ICD-10-CM | POA: Diagnosis not present

## 2020-06-07 DIAGNOSIS — N179 Acute kidney failure, unspecified: Secondary | ICD-10-CM | POA: Diagnosis not present

## 2020-06-07 DIAGNOSIS — E875 Hyperkalemia: Secondary | ICD-10-CM | POA: Diagnosis not present

## 2020-06-07 DIAGNOSIS — E782 Mixed hyperlipidemia: Secondary | ICD-10-CM | POA: Diagnosis not present

## 2020-06-07 DIAGNOSIS — I10 Essential (primary) hypertension: Secondary | ICD-10-CM | POA: Diagnosis not present

## 2020-06-14 DIAGNOSIS — N179 Acute kidney failure, unspecified: Secondary | ICD-10-CM | POA: Diagnosis not present

## 2020-06-14 DIAGNOSIS — E782 Mixed hyperlipidemia: Secondary | ICD-10-CM | POA: Diagnosis not present

## 2020-06-14 DIAGNOSIS — E875 Hyperkalemia: Secondary | ICD-10-CM | POA: Diagnosis not present

## 2020-06-14 DIAGNOSIS — I10 Essential (primary) hypertension: Secondary | ICD-10-CM | POA: Diagnosis not present

## 2020-06-14 DIAGNOSIS — E1110 Type 2 diabetes mellitus with ketoacidosis without coma: Secondary | ICD-10-CM | POA: Diagnosis not present

## 2020-06-14 DIAGNOSIS — N400 Enlarged prostate without lower urinary tract symptoms: Secondary | ICD-10-CM | POA: Diagnosis not present

## 2020-06-14 DIAGNOSIS — R000 Tachycardia, unspecified: Secondary | ICD-10-CM | POA: Diagnosis not present

## 2020-06-14 DIAGNOSIS — I248 Other forms of acute ischemic heart disease: Secondary | ICD-10-CM | POA: Diagnosis not present

## 2020-06-14 DIAGNOSIS — D573 Sickle-cell trait: Secondary | ICD-10-CM | POA: Diagnosis not present

## 2020-06-20 DIAGNOSIS — R300 Dysuria: Secondary | ICD-10-CM | POA: Diagnosis not present

## 2020-06-20 DIAGNOSIS — N390 Urinary tract infection, site not specified: Secondary | ICD-10-CM | POA: Diagnosis not present

## 2020-06-29 ENCOUNTER — Other Ambulatory Visit: Payer: Self-pay

## 2020-06-29 NOTE — Patient Outreach (Signed)
[place] [facility]) Care Management  06/29/2020  [place] [birth year] [id number]   Telephone call to patient for follow up. No answer.  HIPAA compliant voice message left.  Plan: RN CM will attempt patient again in the month of March.   Jone Baseman, RN, MSN [place] Management Care Management Coordinator Direct Line [phone] Cell [phone] Toll Free: [phone]  Fax: [phone]

## 2020-07-09 ENCOUNTER — Other Ambulatory Visit: Payer: Self-pay

## 2020-07-09 NOTE — Patient Outreach (Addendum)
[place] [facility]) Care Management  [place]  07/09/2020   Bhavya [place] Apr 11, 1951 [id number]  Subjective: Telephone call to patient for follow up. Patient reports he is doing good. Blood sugars in 80-115 range.  Discussed diabetes management and CM continued follow up. He verbalized understanding.   Objective:   Encounter Medications:  Outpatient Encounter Medications as of 07/09/2020  Medication Sig  . glucose blood (FREESTYLE TEST STRIPS) test strip Check glucoses twice daily. Once in the morning before breakfast and once in the evening before dinner.  . insulin aspart protamine - aspart (NOVOLOG 70/30 MIX) (70-30) 100 UNIT/ML FlexPen Inject 0.6 mLs (60 Units total) into the skin daily with breakfast.  . insulin aspart protamine - aspart (NOVOLOG 70/30 MIX) (70-30) 100 UNIT/ML FlexPen Inject 0.5 mLs (50 Units total) into the skin daily with supper.  . Insulin Pen Needle (PEN NEEDLES) 31G X 5 MM MISC 1 each by Does not apply route in the morning and at bedtime.  . Lancets (FREESTYLE) lancets Check glucoses twice daily. Once in the morning before breakfast and once in the evening before dinner.   No facility-administered encounter medications on file as of 07/09/2020.    Functional Status:  In your present state of health, do you have any difficulty performing the following activities: 07/09/2020 04/30/2020  Hearing? N N  Vision? Y Y  Comment blurry blurry  Difficulty concentrating or making decisions? N N  Walking or climbing stairs? N N  Dressing or bathing? N N  Doing errands, shopping? N N  Preparing Food and eating ? N N  Using the Toilet? N N  In the past six months, have you accidently leaked urine? N N  Do you have problems with loss of bowel control? N N  Managing your Medications? N N  Managing your Finances? N N  Housekeeping or managing your Housekeeping? N N  Some recent data might be hidden    Fall/Depression Screening: Fall Risk   07/09/2020 04/30/2020  Falls in the past year? 0 0  Number falls in past yr: - 0  Injury with Fall? - 0   PHQ 2/9 Scores 07/09/2020 04/30/2020  PHQ - 2 Score 0 0    Assessment:   Goals Addressed            This Visit's Progress   . THN-Make and Keep All Appointments   On track    Timeframe:  Long-Range Goal Priority:  High Start Date:   04/30/20                          Expected End Date:   12/26/20                Follow Up Date 08/25/20   - arrange a ride through an agency 1 week before appointment    Why is this important?    Part of staying healthy is seeing the doctor for follow-up care.   If you forget your appointments, there are some things you can do to stay on track.    Notes: 05/31/20 Patient seeing physicians regularly.      . THN-Monitor and Manage My Blood Sugar-Diabetes Type 2   On track    Timeframe:  Long-Range Goal Priority:  High Start Date:  04/30/20                           Expected End Date:  12/26/20             Follow Up Date 08/25/20   - check blood sugar at prescribed times - check blood sugar if I feel it is too high or too low - take the blood sugar meter to all doctor visits    Why is this important?    Checking your blood sugar at home helps to keep it from getting very high or very low.   Writing the results in a diary or log helps the doctor know how to care for you.   Your blood sugar log should have the time, date and the results.   Also, write down the amount of insulin or other medicine that you take.   Other information, like what you ate, exercise done and how you were feeling, will also be helpful.     Notes: 05/31/20 Patient sugars ranging 97-123.  Lost 13 lbs. 07/09/20 sugar ranges 80-115    . THN-Obtain Eye Exam-Diabetes Type 2   On track    Timeframe:  Short-Term Goal Priority:  High Start Date:    04/30/20                         Expected End Date:  09/24/20                Follow Up Date 08/25/20   - schedule appointment  with eye doctor    Why is this important?    Eye check-ups are important when you have diabetes.   Vision loss can be prevented.    Notes: Encouraged patient to schedule an appointment with eye doctor. 05/31/20 Patient has not called eye doctor. 07/09/20 Patient to make appointment with eye doctor.          Plan: RN CM will send update to physician and diease management letter.   RN CM will outreach next month. Follow-up:  Patient agrees to Care Plan and Follow-up.   Jone Baseman, RN, MSN [place] Management Care Management Coordinator Direct Line [phone] Cell [phone] Toll Free: [phone]  Fax: [phone]

## 2020-07-25 DIAGNOSIS — N390 Urinary tract infection, site not specified: Secondary | ICD-10-CM | POA: Diagnosis not present

## 2020-08-01 DIAGNOSIS — I129 Hypertensive chronic kidney disease with stage 1 through stage 4 chronic kidney disease, or unspecified chronic kidney disease: Secondary | ICD-10-CM | POA: Diagnosis not present

## 2020-08-01 DIAGNOSIS — E782 Mixed hyperlipidemia: Secondary | ICD-10-CM | POA: Diagnosis not present

## 2020-08-01 DIAGNOSIS — E1165 Type 2 diabetes mellitus with hyperglycemia: Secondary | ICD-10-CM | POA: Diagnosis not present

## 2020-08-03 DIAGNOSIS — E118 Type 2 diabetes mellitus with unspecified complications: Secondary | ICD-10-CM | POA: Diagnosis not present

## 2020-08-03 DIAGNOSIS — E782 Mixed hyperlipidemia: Secondary | ICD-10-CM | POA: Diagnosis not present

## 2020-08-03 DIAGNOSIS — D573 Sickle-cell trait: Secondary | ICD-10-CM | POA: Diagnosis not present

## 2020-08-03 DIAGNOSIS — E1122 Type 2 diabetes mellitus with diabetic chronic kidney disease: Secondary | ICD-10-CM | POA: Diagnosis not present

## 2020-08-03 DIAGNOSIS — Z0000 Encounter for general adult medical examination without abnormal findings: Secondary | ICD-10-CM | POA: Diagnosis not present

## 2020-08-03 DIAGNOSIS — I129 Hypertensive chronic kidney disease with stage 1 through stage 4 chronic kidney disease, or unspecified chronic kidney disease: Secondary | ICD-10-CM | POA: Diagnosis not present

## 2020-08-03 DIAGNOSIS — E1165 Type 2 diabetes mellitus with hyperglycemia: Secondary | ICD-10-CM | POA: Diagnosis not present

## 2020-08-06 DIAGNOSIS — E1122 Type 2 diabetes mellitus with diabetic chronic kidney disease: Secondary | ICD-10-CM | POA: Diagnosis not present

## 2020-08-06 DIAGNOSIS — R300 Dysuria: Secondary | ICD-10-CM | POA: Diagnosis not present

## 2020-08-06 DIAGNOSIS — E1165 Type 2 diabetes mellitus with hyperglycemia: Secondary | ICD-10-CM | POA: Diagnosis not present

## 2020-08-06 DIAGNOSIS — E118 Type 2 diabetes mellitus with unspecified complications: Secondary | ICD-10-CM | POA: Diagnosis not present

## 2020-08-06 DIAGNOSIS — I10 Essential (primary) hypertension: Secondary | ICD-10-CM | POA: Diagnosis not present

## 2020-08-15 ENCOUNTER — Other Ambulatory Visit: Payer: Self-pay

## 2020-08-15 NOTE — Patient Outreach (Signed)
[place] [facility]) Care Management  08/15/2020  [place] Mar 13, 1951 [id number]   Telephone call to patient for disease management follow up.   No answer.  HIPAA compliant voice message left.    Plan: If no return call, RN CM will attempt patient again in the month of May.  Jone Baseman, RN, MSN [place] Management Care Management Coordinator Direct Line [phone] Cell [phone] Toll Free: [phone]  Fax: [phone]

## 2020-08-16 DIAGNOSIS — H52223 Regular astigmatism, bilateral: Secondary | ICD-10-CM | POA: Diagnosis not present

## 2020-08-17 ENCOUNTER — Ambulatory Visit: Payer: Self-pay

## 2020-08-17 DIAGNOSIS — Z794 Long term (current) use of insulin: Secondary | ICD-10-CM | POA: Diagnosis not present

## 2020-08-17 DIAGNOSIS — D573 Sickle-cell trait: Secondary | ICD-10-CM | POA: Diagnosis not present

## 2020-08-17 DIAGNOSIS — Z0000 Encounter for general adult medical examination without abnormal findings: Secondary | ICD-10-CM | POA: Diagnosis not present

## 2020-08-17 DIAGNOSIS — E782 Mixed hyperlipidemia: Secondary | ICD-10-CM | POA: Diagnosis not present

## 2020-08-17 DIAGNOSIS — N182 Chronic kidney disease, stage 2 (mild): Secondary | ICD-10-CM | POA: Diagnosis not present

## 2020-08-17 DIAGNOSIS — E1122 Type 2 diabetes mellitus with diabetic chronic kidney disease: Secondary | ICD-10-CM | POA: Diagnosis not present

## 2020-08-17 DIAGNOSIS — I129 Hypertensive chronic kidney disease with stage 1 through stage 4 chronic kidney disease, or unspecified chronic kidney disease: Secondary | ICD-10-CM | POA: Diagnosis not present

## 2020-08-17 DIAGNOSIS — R809 Proteinuria, unspecified: Secondary | ICD-10-CM | POA: Diagnosis not present

## 2020-09-20 ENCOUNTER — Other Ambulatory Visit: Payer: Self-pay

## 2020-09-20 NOTE — Patient Instructions (Addendum)
Goals    . THN-Make and Keep All Appointments     Timeframe:  Long-Range Goal Priority:  High Start Date:   04/30/20                          Expected End Date:   12/26/20                Follow Up Date 10/25/20   - ask family or friend for a ride    Why is this important?    Part of staying healthy is seeing the doctor for follow-up care.   If you forget your appointments, there are some things you can do to stay on track.    Notes: 05/31/20 Patient seeing physicians regularly.   09/20/20 No transportation problems.    . THN-Monitor and Manage My Blood Sugar-Diabetes Type 2     Timeframe:  Long-Range Goal Priority:  High Start Date:  04/30/20                           Expected End Date:    12/26/20             Follow Up Date 10/25/20   - check blood sugar at prescribed times - check blood sugar if I feel it is too high or too low - take the blood sugar log to all doctor visits    Why is this important?    Checking your blood sugar at home helps to keep it from getting very high or very low.   Writing the results in a diary or log helps the doctor know how to care for you.   Your blood sugar log should have the time, date and the results.   Also, write down the amount of insulin or other medicine that you take.   Other information, like what you ate, exercise done and how you were feeling, will also be helpful.     Notes: 05/31/20 Patient sugars ranging 97-123.  Lost 13 lbs. 07/09/20 sugar ranges 80-115 09/20/20 Patient sugar report around 125. A1c 6.4.  Keep up the great work!! Remain active.         Type 2 Diabetes Mellitus, Self-Care, Adult When you have type 2 diabetes (type 2 diabetes mellitus), you must make sure your blood sugar (glucose) stays in a healthy range. You can do this with:  Nutrition.  Exercise.  Lifestyle changes.  Medicines or insulin, if needed.  Support from your doctors and others. What are the risks? Having diabetes can raise your risk for  other long-term (chronic) health problems. You may get medicines to help prevent these problems. How to stay aware of blood sugar  Check your blood sugar level every day, as often as told.  Have your A1C (hemoglobin A1C) level checked two or more times a year. Have it checked more often if told.  Your doctor will set personal treatment goals for you. In general, you should have these blood sugar levels: ? Before meals: 80-130 mg/dL (4.4-7.2 mmol/L). ? After meals: below 180 mg/dL (10 mmol/L). ? A1C: less than 7%.   How to manage high and low blood sugar Symptoms of high blood sugar High blood sugar is also called hyperglycemia. Know the symptoms of high blood sugar. These may include:  More thirst.  Hunger.  Feeling very tired.  Needing to pee (urinate) more often than normal.  Seeing things blurry. Symptoms of  low blood sugar Low blood sugar is also called hypoglycemia. This is when blood sugar is at or below 70 mg/dL (3.9 mmol/L). Symptoms may include:  Hunger.  Feeling worried or nervous (anxious).  Feeling sweaty and cold to the touch (clammy).  Being dizzy or light-headed.  Feeling sleepy.  A fast heartbeat.  Feeling grouchy (irritable).  Tingling or loss of feeling (numbness) around your mouth, lips, or tongue.  Restless sleep. Diabetes medicines can cause low blood sugar. You are more at risk:  While you exercise.  After exercise.  During sleep.  When you are sick.  When you skip meals or do not eat for a long time. Treating low blood sugar If you think you have low blood sugar, eat or drink something sugary right away. Keep 15 grams of a fast-acting carb (carbohydrate) with you all the time. Make sure your family and friends know how to treat you if you cannot treat yourself. Treating very low blood sugar Severe hypoglycemia is when your blood sugar is at or below 54 mg/dL (3 mmol/L). Severe hypoglycemia is an emergency. Do not wait to see if the  symptoms will go away. Get medical help right away. Call your local emergency services (911 in the [place] Do not drive yourself to the hospital. You may need a glucagon shot if you have very low blood sugar and you cannot eat or drink. Have a family member or friend learn how to check your blood sugar and how to give you a glucagon shot. Ask your doctor if you should have a kit for glucagon shots. Follow these instructions at home: Medicines  Take diabetes medicines as told. If your doctor prescribed insulin or diabetes medicines, take them each day.  Do not run out of insulin or other medicines. Plan ahead.  If you use insulin, change the amount you take based on how active you are and what foods you eat. Your doctor will tell you how to do this.  Take over-the-counter and prescription medicines only as told by your doctor. Eating and drinking  Eat healthy foods. These include: ? Low-fat (lean) proteins. ? Complex carbs, such as whole grains. ? Fresh fruits and vegetables. ? Low-fat dairy products. ? Healthy fats.  Meet with a food expert (dietitian) to make an eating plan.  Follow instructions from your doctor about what you cannot eat or drink.  Drink enough fluid to keep your pee (urine) pale yellow.  Keep track of carbs that you eat. Read food labels and learn serving sizes of foods.  Follow your sick-day plan when you cannot eat or drink as normal. Make this plan with your doctor so it is ready to use.   Activity  Exercise as told by your doctor. You may need to: ? Do stretching and strength exercises 2 or more times a week. ? Do 150 minutes or more of exercise each week that makes your heart beat faster and makes you sweat.  Spread out your exercise over 3 or more days a week.  Do not go more than 2 days in a row without exercise.  Talk with your doctor before you start a new exercise. Your doctor may tell you to change: ? How much insulin or medicines you take. ? How  much food you eat. Lifestyle  Do not use any products that contain nicotine or tobacco, such as cigarettes, e-cigarettes, and chewing tobacco. If you need help quitting, ask your doctor.  If you drink alcohol and your doctor  says alcohol is safe for you: ? Limit how much you use to:  0-1 drink a day for women who are not pregnant.  0-2 drinks a day for men. ? Be aware of how much alcohol is in your drink. In the [place] one drink equals one 12 oz bottle of beer (355 mL), one 5 oz glass of wine (148 mL), or one 1 oz glass of hard liquor (44 mL).  Learn to deal with stress. If you need help, ask your doctor. Body care  Stay up to date with your shots (immunizations).  Have your eyes and feet checked by a doctor as often as told.  Check your skin and feet every day. Check for cuts, bruises, redness, blisters, or sores.  Brush your teeth and gums two times a day. Floss one or more times a day.  Go to the dentist one or more times every 6 months.  Stay at a healthy weight.   General instructions  Share your diabetes care plan with: ? Your work or school. ? People you live with.  Carry a card or wear jewelry that says you have diabetes.  Keep all follow-up visits as told by your doctor. This is important. Questions to ask your doctor  Do I need to meet with a certified expert in diabetes education and care?  Where can I find a support group? Where to find more information  American Diabetes Association: www.diabetes.org  American Association of Diabetes Care and Education Specialists: www.diabeteseducator.org  International Diabetes Federation: MemberVerification.ca Summary  When you have type 2 diabetes, you must make sure your blood sugar (glucose) stays in a healthy range. You can do this with nutrition, exercise, medicines and insulin, and support from doctors and others.  Check your blood sugar every day, or as often as told.  Having diabetes can raise your risk for other  long-term health problems. You may get medicines to help prevent these problems.  Share your diabetes management plan with people at work, school, and home.  Keep all follow-up visits as told by your doctor. This is important. This information is not intended to replace advice given to you by your health care provider. Make sure you discuss any questions you have with your health care provider. Document Revised: 11/16/2019 Document Reviewed: 11/16/2019 Elsevier Patient Education  [place].

## 2020-09-20 NOTE — Patient Outreach (Addendum)
[facility]) Care Management  [place]  09/20/2020   Miguel Boone 10/31/1950 [id number]  Subjective: Telephone call to patient for disease management follow up. Patient reports doing good.  He did his eye exam last month.  He states he does have cataracts but no action needed presently.  A1c 6.4  Congratulated on his success.  Discussed continued diabetes management.  He verbalized understanding.    Objective:   Encounter Medications:  Outpatient Encounter Medications as of 09/20/2020  Medication Sig  . glucose blood (FREESTYLE TEST STRIPS) test strip Check glucoses twice daily. Once in the morning before breakfast and once in the evening before dinner.  . insulin aspart protamine - aspart (NOVOLOG 70/30 MIX) (70-30) 100 UNIT/ML FlexPen Inject 0.6 mLs (60 Units total) into the skin daily with breakfast.  . insulin aspart protamine - aspart (NOVOLOG 70/30 MIX) (70-30) 100 UNIT/ML FlexPen Inject 0.5 mLs (50 Units total) into the skin daily with supper.  . Insulin Pen Needle (PEN NEEDLES) 31G X 5 MM MISC 1 each by Does not apply route in the morning and at bedtime.  . Lancets (FREESTYLE) lancets Check glucoses twice daily. Once in the morning before breakfast and once in the evening before dinner.   No facility-administered encounter medications on file as of 09/20/2020.    Functional Status:  In your present state of health, do you have any difficulty performing the following activities: 07/09/2020 04/30/2020  Hearing? N N  Vision? Y Y  Comment blurry blurry  Difficulty concentrating or making decisions? N N  Walking or climbing stairs? N N  Dressing or bathing? N N  Doing errands, shopping? N N  Preparing Food and eating ? N N  Using the Toilet? N N  In the past six months, have you accidently leaked urine? N N  Do you have problems with loss of bowel control? N N  Managing your Medications? N N  Managing your Finances? N N  Housekeeping or managing  your Housekeeping? N N  Some recent data might be hidden    Fall/Depression Screening: Fall Risk  07/09/2020 04/30/2020  Falls in the past year? 0 0  Number falls in past yr: - 0  Injury with Fall? - 0   PHQ 2/9 Scores 07/09/2020 04/30/2020  PHQ - 2 Score 0 0    Assessment:  Goals Addressed            This Visit's Progress   . THN-Make and Keep All Appointments   On track    Barriers: None Timeframe:  Long-Range Goal Priority:  High Start Date:   04/30/20                          Expected End Date:   12/26/20                Follow Up Date 10/25/20   - ask family or friend for a ride    Why is this important?    Part of staying healthy is seeing the doctor for follow-up care.   If you forget your appointments, there are some things you can do to stay on track.    Notes: 05/31/20 Patient seeing physicians regularly.   09/20/20 No transportation problems.    . THN-Monitor and Manage My Blood Sugar-Diabetes Type 2   On track    Barriers: Health Behaviors Knowledge Timeframe:  Long-Range Goal Priority:  High Start Date:  04/30/20  Expected End Date:    12/26/20             Follow Up Date 10/25/20   - check blood sugar at prescribed times - check blood sugar if I feel it is too high or too low - take the blood sugar log to all doctor visits    Why is this important?    Checking your blood sugar at home helps to keep it from getting very high or very low.   Writing the results in a diary or log helps the doctor know how to care for you.   Your blood sugar log should have the time, date and the results.   Also, write down the amount of insulin or other medicine that you take.   Other information, like what you ate, exercise done and how you were feeling, will also be helpful.     Notes: 05/31/20 Patient sugars ranging 97-123.  Lost 13 lbs. 07/09/20 sugar ranges 80-115 09/20/20 Patient sugar report around 125. A1c 6.4.  Keep up the great work!!  Remain active.      . COMPLETED: THN-Obtain Eye Exam-Diabetes Type 2       Timeframe:  Short-Term Goal Priority:  High Start Date:    04/30/20                         Expected End Date:  09/24/20                Follow Up Date 08/25/20   - schedule appointment with eye doctor    Why is this important?    Eye check-ups are important when you have diabetes.   Vision loss can be prevented.    Notes: Encouraged patient to schedule an appointment with eye doctor. 05/31/20 Patient has not called eye doctor. 07/09/20 Patient to make appointment with eye doctor.   09/20/20 Patient saw eye doctor on 08/17/20        Plan: RN CM will follow up next month.   Follow-up:  Patient agrees to follow-up and careplan.   Jone Baseman, RN, MSN [place] [facility] Care Management Coordinator Direct Line [phone] Cell [phone] Toll Free: [phone]  Fax: [phone]

## 2020-09-21 DIAGNOSIS — E113291 Type 2 diabetes mellitus with mild nonproliferative diabetic retinopathy without macular edema, right eye: Secondary | ICD-10-CM | POA: Diagnosis not present

## 2020-10-04 DIAGNOSIS — E1122 Type 2 diabetes mellitus with diabetic chronic kidney disease: Secondary | ICD-10-CM | POA: Diagnosis not present

## 2020-10-16 DIAGNOSIS — I129 Hypertensive chronic kidney disease with stage 1 through stage 4 chronic kidney disease, or unspecified chronic kidney disease: Secondary | ICD-10-CM | POA: Diagnosis not present

## 2020-10-16 DIAGNOSIS — E1122 Type 2 diabetes mellitus with diabetic chronic kidney disease: Secondary | ICD-10-CM | POA: Diagnosis not present

## 2020-10-17 ENCOUNTER — Other Ambulatory Visit: Payer: Self-pay

## 2020-10-17 NOTE — Patient Outreach (Signed)
[facility]) Care Management  [place]  10/17/2020   Miguel Boone [birth year] [id number]  Subjective: Telephone call to patient for diabetes disease management.  Patient reports seeing physician on yesterday.  Recent decrease in insulin.  Discussed continued diabetes management. He verbalized understanding and voices no concerns.   Objective:   Encounter Medications:  Outpatient Encounter Medications as of 10/17/2020  Medication Sig Note   glucose blood (FREESTYLE TEST STRIPS) test strip Check glucoses twice daily. Once in the morning before breakfast and once in the evening before dinner.    insulin aspart protamine - aspart (NOVOLOG 70/30 MIX) (70-30) 100 UNIT/ML FlexPen Inject 0.6 mLs (60 Units total) into the skin daily with breakfast. 10/17/2020: Taking 50 units in am.   insulin aspart protamine - aspart (NOVOLOG 70/30 MIX) (70-30) 100 UNIT/ML FlexPen Inject 0.5 mLs (50 Units total) into the skin daily with supper.    Insulin Pen Needle (PEN NEEDLES) 31G X 5 MM MISC 1 each by Does not apply route in the morning and at bedtime.    Lancets (FREESTYLE) lancets Check glucoses twice daily. Once in the morning before breakfast and once in the evening before dinner.    metFORMIN (GLUCOPHAGE-XR) 500 MG 24 hr tablet 1 tablet with evening meal    telmisartan (MICARDIS) 40 MG tablet 1 tablet    No facility-administered encounter medications on file as of 10/17/2020.    Functional Status:  In your present state of health, do you have any difficulty performing the following activities: 07/09/2020 04/30/2020  Hearing? N N  Vision? Y Y  Comment blurry blurry  Difficulty concentrating or making decisions? N N  Walking or climbing stairs? N N  Dressing or bathing? N N  Doing errands, shopping? N N  Preparing Food and eating ? N N  Using the Toilet? N N  In the past six months, have you accidently leaked urine? N N  Do you have problems with loss of bowel control?  N N  Managing your Medications? N N  Managing your Finances? N N  Housekeeping or managing your Housekeeping? N N  Some recent data might be hidden    Fall/Depression Screening: Fall Risk  10/17/2020 07/09/2020 04/30/2020  Falls in the past year? 0 0 0  Number falls in past yr: - - 0  Injury with Fall? - - 0   PHQ 2/9 Scores 07/09/2020 04/30/2020  PHQ - 2 Score 0 0    Assessment:   Care Plan Care Plan : Diabetes Type 2 (Adult)  Updates made by Jon Billings, RN since 10/17/2020 12:00 AM     Problem: Glycemic Management (Diabetes, Type 2)      Long-Range Goal: Glycemic Management Optimized as evidenced by A1c less than 8.   Start Date: 04/30/2020  Expected End Date: 04/27/2021  This Visit's Progress: On track  Recent Progress: On track  Priority: High  Note:   Evidence-based guidance:  Promote self-monitoring of blood glucose levels.  Assess and address barriers to management plan, such as food insecurity, age,  developmental ability, depression, anxiety, fear of hypoglycemia or weight gain,  as well as medication cost, side effects and complicated regimen.  Notes:     Task: Alleviate Barriers to Glycemic Management   Due Date: 01/25/2021  Priority: Routine  Responsible User: Jon Billings, RN  Note:   Care Management Activities:    - blood glucose readings reviewed - self-awareness of signs/symptoms of hypo or hyperglycemia encouraged - use of blood  glucose monitoring log promoted    Notes: 07/09/20 Blood sugar range 80-115 per patient. 10/17/20 Patient blood sugar 115 this am but did have a drop last night due to a light meal.  Discussed carbohydrate counting and making sure he has enough carbohydrates on board.  Diabetes Management Discussed: Medication adherence Reviewed importance of limiting carbs such as rice, potatoes, breads, and pastas. Also discussed limiting sweets and sugary drinks.  Discussed importance of portion control.   09/20/20 Patient A1c 6.4.   Discussed continued diabetes management.      Goals Addressed             This Visit's Progress    COMPLETED: THN-Make and Keep All Appointments       Barriers: None Timeframe:  Long-Range Goal Priority:  High Start Date:   04/30/20                          Expected End Date:   12/26/20                Follow Up Date 10/25/20   - ask family or friend for a ride    Why is this important?   Part of staying healthy is seeing the doctor for follow-up care.  If you forget your appointments, there are some things you can do to stay on track.    Notes: 05/31/20 Patient seeing physicians regularly.   09/20/20 No transportation problems.      THN-Monitor and Manage My Blood Sugar-Diabetes Type 2   On track    Barriers: Health Behaviors Knowledge Timeframe:  Long-Range Goal Priority:  High Start Date:  04/30/20                           Expected End Date:    04/27/21         Follow Up Date 11/25/20   - check blood sugar at prescribed times - check blood sugar if I feel it is too high or too low - take the blood sugar log to all doctor visits    Why is this important?   Checking your blood sugar at home helps to keep it from getting very high or very low.  Writing the results in a diary or log helps the doctor know how to care for you.  Your blood sugar log should have the time, date and the results.  Also, write down the amount of insulin or other medicine that you take.  Other information, like what you ate, exercise done and how you were feeling, will also be helpful.     Notes: 05/31/20 Patient sugars ranging 97-123.  Lost 13 lbs. 07/09/20 sugar ranges 80-115 09/20/20 Patient sugar report around 125. A1c 6.4.  Keep up the great work!! Remain active.   10/17/20 Patient sugar report 115 this am.  Discussed carbohydrate in take to sustain sugars. Diabetes Management Discussed: Medication adherence Reviewed importance of limiting carbs such as rice, potatoes, breads, and pastas. Also  discussed limiting sweets and sugary drinks.  Discussed importance of portion control.             Plan:  Follow-up: Patient agrees to Care Plan and Follow-up. Follow-up in 4-6- week(s)  Jone Baseman, RN, MSN [place] [facility] Care Management Coordinator Direct Line [phone] Cell [phone] Toll Free: [phone]  Fax: [phone]

## 2020-10-17 NOTE — Patient Instructions (Signed)
Goals Addressed             This Visit's Progress    COMPLETED: THN-Make and Keep All Appointments       Barriers: None Timeframe:  Long-Range Goal Priority:  High Start Date:   04/30/20                          Expected End Date:   12/26/20                Follow Up Date 10/25/20   - ask family or friend for a ride    Why is this important?   Part of staying healthy is seeing the doctor for follow-up care.  If you forget your appointments, there are some things you can do to stay on track.    Notes: 05/31/20 Patient seeing physicians regularly.   09/20/20 No transportation problems.      THN-Monitor and Manage My Blood Sugar-Diabetes Type 2   On track    Barriers: Health Behaviors Knowledge Timeframe:  Long-Range Goal Priority:  High Start Date:  04/30/20                           Expected End Date:    04/27/21         Follow Up Date 11/25/20   - check blood sugar at prescribed times - check blood sugar if I feel it is too high or too low - take the blood sugar log to all doctor visits    Why is this important?   Checking your blood sugar at home helps to keep it from getting very high or very low.  Writing the results in a diary or log helps the doctor know how to care for you.  Your blood sugar log should have the time, date and the results.  Also, write down the amount of insulin or other medicine that you take.  Other information, like what you ate, exercise done and how you were feeling, will also be helpful.     Notes: 05/31/20 Patient sugars ranging 97-123.  Lost 13 lbs. 07/09/20 sugar ranges 80-115 09/20/20 Patient sugar report around 125. A1c 6.4.  Keep up the great work!! Remain active.   10/17/20 Patient sugar report 115 this am.  Discussed carbohydrate in take to sustain sugars. Diabetes Management Discussed: Medication adherence Reviewed importance of limiting carbs such as rice, potatoes, breads, and pastas. Also discussed limiting sweets and sugary drinks.   Discussed importance of portion control.

## 2020-10-25 DIAGNOSIS — H2513 Age-related nuclear cataract, bilateral: Secondary | ICD-10-CM | POA: Diagnosis not present

## 2020-10-25 DIAGNOSIS — H353131 Nonexudative age-related macular degeneration, bilateral, early dry stage: Secondary | ICD-10-CM | POA: Diagnosis not present

## 2020-10-25 DIAGNOSIS — E113293 Type 2 diabetes mellitus with mild nonproliferative diabetic retinopathy without macular edema, bilateral: Secondary | ICD-10-CM | POA: Diagnosis not present

## 2020-10-25 DIAGNOSIS — H35013 Changes in retinal vascular appearance, bilateral: Secondary | ICD-10-CM | POA: Diagnosis not present

## 2020-10-25 DIAGNOSIS — H35033 Hypertensive retinopathy, bilateral: Secondary | ICD-10-CM | POA: Diagnosis not present

## 2020-11-08 ENCOUNTER — Other Ambulatory Visit: Payer: Self-pay

## 2020-11-08 NOTE — Patient Outreach (Signed)
[facility]) Care Management  [place]  11/08/2020   Miguel Boone [birth year] [id number]  Subjective: Telephone call to patient for disease management support.  He reports some increased sugars due to eating a broccoli and rice casserole.  Highest blood sugar 167. Reiterated diabetes management.  He verbalized understanding.    Objective:   Encounter Medications:  Outpatient Encounter Medications as of 11/08/2020  Medication Sig Note   glucose blood (FREESTYLE TEST STRIPS) test strip Check glucoses twice daily. Once in the morning before breakfast and once in the evening before dinner.    insulin aspart protamine - aspart (NOVOLOG 70/30 MIX) (70-30) 100 UNIT/ML FlexPen Inject 0.6 mLs (60 Units total) into the skin daily with breakfast. 10/17/2020: Taking 50 units in am.   insulin aspart protamine - aspart (NOVOLOG 70/30 MIX) (70-30) 100 UNIT/ML FlexPen Inject 0.5 mLs (50 Units total) into the skin daily with supper.    Insulin Pen Needle (PEN NEEDLES) 31G X 5 MM MISC 1 each by Does not apply route in the morning and at bedtime.    Lancets (FREESTYLE) lancets Check glucoses twice daily. Once in the morning before breakfast and once in the evening before dinner.    metFORMIN (GLUCOPHAGE-XR) 500 MG 24 hr tablet 1 tablet with evening meal    telmisartan (MICARDIS) 40 MG tablet 1 tablet    No facility-administered encounter medications on file as of 11/08/2020.    Functional Status:  In your present state of health, do you have any difficulty performing the following activities: 07/09/2020 04/30/2020  Hearing? N N  Vision? Y Y  Comment blurry blurry  Difficulty concentrating or making decisions? N N  Walking or climbing stairs? N N  Dressing or bathing? N N  Doing errands, shopping? N N  Preparing Food and eating ? N N  Using the Toilet? N N  In the past six months, have you accidently leaked urine? N N  Do you have problems with loss of bowel control? N N   Managing your Medications? N N  Managing your Finances? N N  Housekeeping or managing your Housekeeping? N N  Some recent data might be hidden    Fall/Depression Screening: Fall Risk  10/17/2020 07/09/2020 04/30/2020  Falls in the past year? 0 0 0  Number falls in past yr: - - 0  Injury with Fall? - - 0   PHQ 2/9 Scores 07/09/2020 04/30/2020  PHQ - 2 Score 0 0    Assessment:   Care Plan Care Plan : Diabetes Type 2 (Adult)  Updates made by Jon Billings, RN since 11/08/2020 12:00 AM     Problem: Glycemic Management (Diabetes, Type 2)      Long-Range Goal: Glycemic Management Optimized as evidenced by A1c less than 8.   Start Date: 04/30/2020  Expected End Date: 04/27/2021  This Visit's Progress: On track  Recent Progress: On track  Priority: High  Note:   Evidence-based guidance:  Promote self-monitoring of blood glucose levels.  Assess and address barriers to management plan, such as food insecurity, age,  developmental ability, depression, anxiety, fear of hypoglycemia or weight gain,  as well as medication cost, side effects and complicated regimen.  Notes:     Task: Alleviate Barriers to Glycemic Management   Due Date: 04/27/2021  Priority: Routine  Responsible User: Jon Billings, RN  Note:   Care Management Activities:    - blood glucose readings reviewed - self-awareness of signs/symptoms of hypo or hyperglycemia encouraged -  use of blood glucose monitoring log promoted    Notes: 07/09/20 Blood sugar range 80-115 per patient. 10/17/20 Patient blood sugar 115 this am but did have a drop last night due to a light meal.  Discussed carbohydrate counting and making sure he has enough carbohydrates on board.  Diabetes Management Discussed: Medication adherence Reviewed importance of limiting carbs such as rice, potatoes, breads, and pastas. Also discussed limiting sweets and sugary drinks.  Discussed importance of portion control.   09/20/20 Patient A1c 6.4.  Discussed  continued diabetes management.  11/08/20 Patient reports doing ok but sugar ran high after eating a a broccoli and rice casserole.  Highest sugar was 167.  Reiterated diabetes management.        Goals Addressed             This Visit's Progress    THN-Monitor and Manage My Blood Sugar-Diabetes Type 2   On track    Barriers: Health Behaviors Knowledge Timeframe:  Long-Range Goal Priority:  High Start Date:  04/30/20                           Expected End Date:    04/27/21         Follow Up Date 12/26/20   - check blood sugar at prescribed times - check blood sugar if I feel it is too high or too low - take the blood sugar log to all doctor visits    Why is this important?   Checking your blood sugar at home helps to keep it from getting very high or very low.  Writing the results in a diary or log helps the doctor know how to care for you.  Your blood sugar log should have the time, date and the results.  Also, write down the amount of insulin or other medicine that you take.  Other information, like what you ate, exercise done and how you were feeling, will also be helpful.     Notes: 05/31/20 Patient sugars ranging 97-123.  Lost 13 lbs. 07/09/20 sugar ranges 80-115 09/20/20 Patient sugar report around 125. A1c 6.4.  Keep up the great work!! Remain active.   10/17/20 Patient sugar report 115 this am.  Discussed carbohydrate in take to sustain sugars. Diabetes Management Discussed: Medication adherence Reviewed importance of limiting carbs such as rice, potatoes, breads, and pastas. Also discussed limiting sweets and sugary drinks.  Discussed importance of portion control.   11/08/20 Patient reports doing ok but sugar ran high after eating a a broccoli and rice casserole.  Highest sugar was 167.  Reiterated diabetes management.             Plan:  Follow-up: Patient agrees to Care Plan and Follow-up. Follow-up in 6-8 week(s)  Jone Baseman, RN, MSN [place] [facility] Care  Management Coordinator Direct Line [phone] Cell [phone] Toll Free: [phone]  Fax: [phone]

## 2020-11-08 NOTE — Patient Instructions (Signed)
Goals Addressed             This Visit's Progress    THN-Monitor and Manage My Blood Sugar-Diabetes Type 2   On track    Barriers: Health Behaviors Knowledge Timeframe:  Long-Range Goal Priority:  High Start Date:  04/30/20                           Expected End Date:    04/27/21         Follow Up Date 12/26/20   - check blood sugar at prescribed times - check blood sugar if I feel it is too high or too low - take the blood sugar log to all doctor visits    Why is this important?   Checking your blood sugar at home helps to keep it from getting very high or very low.  Writing the results in a diary or log helps the doctor know how to care for you.  Your blood sugar log should have the time, date and the results.  Also, write down the amount of insulin or other medicine that you take.  Other information, like what you ate, exercise done and how you were feeling, will also be helpful.     Notes: 05/31/20 Patient sugars ranging 97-123.  Lost 13 lbs. 07/09/20 sugar ranges 80-115 09/20/20 Patient sugar report around 125. A1c 6.4.  Keep up the great work!! Remain active.   10/17/20 Patient sugar report 115 this am.  Discussed carbohydrate in take to sustain sugars. Diabetes Management Discussed: Medication adherence Reviewed importance of limiting carbs such as rice, potatoes, breads, and pastas. Also discussed limiting sweets and sugary drinks.  Discussed importance of portion control.   11/08/20 Patient reports doing ok but sugar ran high after eating a a broccoli and rice casserole.  Highest sugar was 167.  Reiterated diabetes management.

## 2020-11-14 ENCOUNTER — Ambulatory Visit: Payer: Self-pay

## 2020-11-26 DIAGNOSIS — Z1212 Encounter for screening for malignant neoplasm of rectum: Secondary | ICD-10-CM | POA: Diagnosis not present

## 2020-11-26 DIAGNOSIS — Z1211 Encounter for screening for malignant neoplasm of colon: Secondary | ICD-10-CM | POA: Diagnosis not present

## 2020-12-06 DIAGNOSIS — R195 Other fecal abnormalities: Secondary | ICD-10-CM | POA: Diagnosis not present

## 2020-12-20 ENCOUNTER — Other Ambulatory Visit: Payer: Self-pay

## 2020-12-20 NOTE — Patient Outreach (Signed)
[facility]) Care Management  [name]  12/20/2020   Miguel Boone 1950/11/17 [id number]  Subjective: Telephone call to patient for disease management follow up. Patient doing well.  Reports he will need a colonoscopy as his cologard was positive for blood.  Discussed colonoscopy.  Diabetes management continues.   Objective:   Encounter Medications:  Outpatient Encounter Medications as of 12/20/2020  Medication Sig Note   glucose blood (FREESTYLE TEST STRIPS) test strip Check glucoses twice daily. Once in the morning before breakfast and once in the evening before dinner.    insulin aspart protamine - aspart (NOVOLOG 70/30 MIX) (70-30) 100 UNIT/ML FlexPen Inject 0.6 mLs (60 Units total) into the skin daily with breakfast. 10/17/2020: Taking 50 units in am.   insulin aspart protamine - aspart (NOVOLOG 70/30 MIX) (70-30) 100 UNIT/ML FlexPen Inject 0.5 mLs (50 Units total) into the skin daily with supper.    Insulin Pen Needle (PEN NEEDLES) 31G X 5 MM MISC 1 each by Does not apply route in the morning and at bedtime.    Lancets (FREESTYLE) lancets Check glucoses twice daily. Once in the morning before breakfast and once in the evening before dinner.    metFORMIN (GLUCOPHAGE-XR) 500 MG 24 hr tablet 1 tablet with evening meal    telmisartan (MICARDIS) 40 MG tablet 1 tablet    No facility-administered encounter medications on file as of 12/20/2020.    Functional Status:  In your present state of health, do you have any difficulty performing the following activities: 07/09/2020 04/30/2020  Hearing? N N  Vision? Y Y  Comment blurry blurry  Difficulty concentrating or making decisions? N N  Walking or climbing stairs? N N  Dressing or bathing? N N  Doing errands, shopping? N N  Preparing Food and eating ? N N  Using the Toilet? N N  In the past six months, have you accidently leaked urine? N N  Do you have problems with loss of bowel control? N N  Managing  your Medications? N N  Managing your Finances? N N  Housekeeping or managing your Housekeeping? N N  Some recent data might be hidden    Fall/Depression Screening: Fall Risk  10/17/2020 07/09/2020 04/30/2020  Falls in the past year? 0 0 0  Number falls in past yr: - - 0  Injury with Fall? - - 0   PHQ 2/9 Scores 07/09/2020 04/30/2020  PHQ - 2 Score 0 0    Assessment:   Care Plan Care Plan : Diabetes Type 2 (Adult)  Updates made by Jon Billings, RN since 12/20/2020 12:00 AM     Problem: Glycemic Management (Diabetes, Type 2)      Long-Range Goal: Glycemic Management Optimized as evidenced by A1c less than 8.   Start Date: 04/30/2020  Expected End Date: 04/27/2021  This Visit's Progress: On track  Recent Progress: On track  Priority: High  Note:   Evidence-based guidance:  Promote self-monitoring of blood glucose levels.  Assess and address barriers to management plan, such as food insecurity, age,  developmental ability, depression, anxiety, fear of hypoglycemia or weight gain,  as well as medication cost, side effects and complicated regimen.  Notes:     Task: Alleviate Barriers to Glycemic Management   Due Date: 04/27/2021  Priority: Routine  Responsible User: Jon Billings, RN  Note:   Care Management Activities:    - blood glucose readings reviewed - self-awareness of signs/symptoms of hypo or hyperglycemia encouraged - use of  blood glucose monitoring log promoted    Notes: 07/09/20 Blood sugar range 80-115 per patient. 10/17/20 Patient blood sugar 115 this am but did have a drop last night due to a light meal.  Discussed carbohydrate counting and making sure he has enough carbohydrates on board.  Diabetes Management Discussed: Medication adherence Reviewed importance of limiting carbs such as rice, potatoes, breads, and pastas. Also discussed limiting sweets and sugary drinks.  Discussed importance of portion control.   09/20/20 Patient A1c 6.4.  Discussed continued  diabetes management.  11/08/20 Patient reports doing ok but sugar ran high after eating a a broccoli and rice casserole.  Highest sugar was 167.  Reiterated diabetes management.    12/20/20 Patient blood sugar range 125-130. Continued Diabetes management encouraged      Goals Addressed               This Visit's Progress     THN-Monitor and Manage My Blood Sugar-Diabetes Type 2 (pt-stated)   On track     Barriers: Health Behaviors Knowledge Timeframe:  Long-Range Goal Priority:  High Start Date:  04/30/20                           Expected End Date:    04/27/21         Follow Up Date 01/25/21  - check blood sugar at prescribed times - check blood sugar if I feel it is too high or too low - take the blood sugar log to all doctor visits    Why is this important?   Checking your blood sugar at home helps to keep it from getting very high or very low.  Writing the results in a diary or log helps the doctor know how to care for you.  Your blood sugar log should have the time, date and the results.  Also, write down the amount of insulin or other medicine that you take.  Other information, like what you ate, exercise done and how you were feeling, will also be helpful.     Notes: 05/31/20 Patient sugars ranging 97-123.  Lost 13 lbs. 07/09/20 sugar ranges 80-115 09/20/20 Patient sugar report around 125. A1c 6.4.  Keep up the great work!! Remain active.   10/17/20 Patient sugar report 115 this am.  Discussed carbohydrate in take to sustain sugars. Diabetes Management Discussed: Medication adherence Reviewed importance of limiting carbs such as rice, potatoes, breads, and pastas. Also discussed limiting sweets and sugary drinks.  Discussed importance of portion control.   11/08/20 Patient reports doing ok but sugar ran high after eating a a broccoli and rice casserole.  Highest sugar was 167.  Reiterated diabetes management.      12/20/20 Continued diabetes management encouraged. Blood sugar  ranges 125-130.  Keep up the great work!!        Plan:  Follow-up: Patient agrees to Care Plan and Follow-up. Follow-up in 4-6 week(s)  Jone Baseman, RN, MSN [place] [facility] Care Management Coordinator Direct Line [phone] Cell [phone] Toll Free: [phone]  Fax: [phone]

## 2020-12-20 NOTE — Patient Instructions (Signed)
Goals Addressed               This Visit's Progress     THN-Monitor and Manage My Blood Sugar-Diabetes Type 2 (pt-stated)   On track     Barriers: Health Behaviors Knowledge Timeframe:  Long-Range Goal Priority:  High Start Date:  04/30/20                           Expected End Date:    04/27/21         Follow Up Date 01/25/21  - check blood sugar at prescribed times - check blood sugar if I feel it is too high or too low - take the blood sugar log to all doctor visits    Why is this important?   Checking your blood sugar at home helps to keep it from getting very high or very low.  Writing the results in a diary or log helps the doctor know how to care for you.  Your blood sugar log should have the time, date and the results.  Also, write down the amount of insulin or other medicine that you take.  Other information, like what you ate, exercise done and how you were feeling, will also be helpful.     Notes: 05/31/20 Patient sugars ranging 97-123.  Lost 13 lbs. 07/09/20 sugar ranges 80-115 09/20/20 Patient sugar report around 125. A1c 6.4.  Keep up the great work!! Remain active.   10/17/20 Patient sugar report 115 this am.  Discussed carbohydrate in take to sustain sugars. Diabetes Management Discussed: Medication adherence Reviewed importance of limiting carbs such as rice, potatoes, breads, and pastas. Also discussed limiting sweets and sugary drinks.  Discussed importance of portion control.   11/08/20 Patient reports doing ok but sugar ran high after eating a a broccoli and rice casserole.  Highest sugar was 167.  Reiterated diabetes management.      12/20/20 Continued diabetes management encouraged. Blood sugar ranges 125-130.  Keep up the great work!!

## 2020-12-28 DIAGNOSIS — E782 Mixed hyperlipidemia: Secondary | ICD-10-CM | POA: Diagnosis not present

## 2020-12-28 DIAGNOSIS — E1122 Type 2 diabetes mellitus with diabetic chronic kidney disease: Secondary | ICD-10-CM | POA: Diagnosis not present

## 2021-01-04 DIAGNOSIS — Z794 Long term (current) use of insulin: Secondary | ICD-10-CM | POA: Diagnosis not present

## 2021-01-04 DIAGNOSIS — Z23 Encounter for immunization: Secondary | ICD-10-CM | POA: Diagnosis not present

## 2021-01-04 DIAGNOSIS — R1011 Right upper quadrant pain: Secondary | ICD-10-CM | POA: Diagnosis not present

## 2021-01-04 DIAGNOSIS — I129 Hypertensive chronic kidney disease with stage 1 through stage 4 chronic kidney disease, or unspecified chronic kidney disease: Secondary | ICD-10-CM | POA: Diagnosis not present

## 2021-01-04 DIAGNOSIS — E103393 Type 1 diabetes mellitus with moderate nonproliferative diabetic retinopathy without macular edema, bilateral: Secondary | ICD-10-CM | POA: Diagnosis not present

## 2021-01-04 DIAGNOSIS — R051 Acute cough: Secondary | ICD-10-CM | POA: Diagnosis not present

## 2021-01-04 DIAGNOSIS — E782 Mixed hyperlipidemia: Secondary | ICD-10-CM | POA: Diagnosis not present

## 2021-01-04 DIAGNOSIS — N182 Chronic kidney disease, stage 2 (mild): Secondary | ICD-10-CM | POA: Diagnosis not present

## 2021-01-08 ENCOUNTER — Other Ambulatory Visit: Payer: Self-pay

## 2021-01-08 ENCOUNTER — Encounter (HOSPITAL_COMMUNITY): Payer: Self-pay | Admitting: Emergency Medicine

## 2021-01-08 ENCOUNTER — Ambulatory Visit (HOSPITAL_COMMUNITY)
Admission: EM | Admit: 2021-01-08 | Discharge: 2021-01-08 | Disposition: A | Discharge status: Home / Self Care | Payer: Medicare HMO | Attending: Emergency Medicine | Admitting: Emergency Medicine

## 2021-01-08 DIAGNOSIS — Z20822 Contact with and (suspected) exposure to covid-19: Secondary | ICD-10-CM | POA: Insufficient documentation

## 2021-01-08 DIAGNOSIS — B349 Viral infection, unspecified: Secondary | ICD-10-CM

## 2021-01-08 LAB — SARS CORONAVIRUS 2 (TAT 6-24 HRS): SARS Coronavirus 2: NEGATIVE

## 2021-01-08 MED ORDER — IBUPROFEN 800 MG PO TABS: 800.0000 mg | ORAL_TABLET | Freq: Three times a day (TID) | ORAL | 0 refills | Status: AC

## 2021-01-08 MED ORDER — IBUPROFEN 800 MG PO TABS: 800.0000 mg | ORAL_TABLET | Freq: Three times a day (TID) | ORAL | 0 refills | Status: DC

## 2021-01-08 MED ORDER — BENZONATATE 100 MG PO CAPS: 100.0000 mg | ORAL_CAPSULE | Freq: Three times a day (TID) | ORAL | 0 refills | Status: DC

## 2021-01-08 MED ORDER — PROMETHAZINE-DM 6.25-15 MG/5ML PO SYRP: 5.0000 mL | ORAL_SOLUTION | Freq: Four times a day (QID) | ORAL | 0 refills | Status: DC | PRN

## 2021-01-08 MED ORDER — PROMETHAZINE-DM 6.25-15 MG/5ML PO SYRP: 5.0000 mL | ORAL_SOLUTION | Freq: Four times a day (QID) | ORAL | 0 refills | Status: AC | PRN

## 2021-01-08 MED ORDER — BENZONATATE 100 MG PO CAPS: 100.0000 mg | ORAL_CAPSULE | Freq: Three times a day (TID) | ORAL | 0 refills | Status: AC

## 2021-01-08 NOTE — ED Triage Notes (Signed)
Pt reports dry cough for day or two as well as right side pains. Reports "feels like something is moving on that side". Denies n/v/d or urinary problems.

## 2021-01-08 NOTE — ED Provider Notes (Signed)
[facility]    CSN: [id number] Arrival date & time: 01/08/21  0840      History   Chief Complaint Chief Complaint  Patient presents with   Abdominal Pain   Cough    HPI Miguel Boone is a 70 y.o. male.   Patient presents with nonproductive cough, ear fullness, nasal congestion, rhinorrhea, intermittent sore throat described as irritation and right-sided abdominal pain described as "something moving on the inside" for 4 days.  No known sick contacts but does endorse that he has been going around outside without masks on.  Vaccinated.  Has not attempted treatment.  Denies chest pain or tightness, wheezing, shortness of breath, headaches, nausea, vomiting, diarrhea.  History of hypertension, hyperlipidemia, morbid obesity, type 2 diabetes.  Past Medical History:  Diagnosis Date   BPH without obstruction/lower urinary tract symptoms    DM2 (diabetes mellitus, type 2) (HCC)    HTN (hypertension)    Microalbuminuria    Mixed hyperlipidemia    Morbid obesity ([place])    Sickle cell trait [facility])     Patient Active Problem List   Diagnosis Date Noted   AKI (acute kidney injury) ([place]) 04/24/2020   DKA, type 2 ([place]) 04/24/2020   HTN (hypertension) 04/24/2020   HLD (hyperlipidemia) 04/24/2020    History reviewed. No pertinent surgical history.     Home Medications    Prior to Admission medications   Medication Sig Start Date End Date Taking? Authorizing Provider  benzonatate (TESSALON) 100 MG capsule Take 1 capsule (100 mg total) by mouth every 8 (eight) hours. 01/08/21   Akosua Constantine, Leitha Schuller, NP  glucose blood (FREESTYLE TEST STRIPS) test strip Check glucoses twice daily. Once in the morning before breakfast and once in the evening before dinner. 04/26/20   Swayze, Ava, DO  ibuprofen (ADVIL) 800 MG tablet Take 1 tablet (800 mg total) by mouth 3 (three) times daily. 01/08/21   Oral Remache, Leitha Schuller, NP  insulin aspart protamine - aspart (NOVOLOG 70/30 MIX) (70-30) 100  UNIT/ML FlexPen Inject 0.6 mLs (60 Units total) into the skin daily with breakfast. 04/26/20   Swayze, Ava, DO  insulin aspart protamine - aspart (NOVOLOG 70/30 MIX) (70-30) 100 UNIT/ML FlexPen Inject 0.5 mLs (50 Units total) into the skin daily with supper. 04/26/20   Swayze, Ava, DO  Insulin Pen Needle (PEN NEEDLES) 31G X 5 MM MISC 1 each by Does not apply route in the morning and at bedtime. 04/26/20   Swayze, Ava, DO  Lancets (FREESTYLE) lancets Check glucoses twice daily. Once in the morning before breakfast and once in the evening before dinner. 04/26/20   Swayze, Ava, DO  metFORMIN (GLUCOPHAGE-XR) 500 MG 24 hr tablet 1 tablet with evening meal 05/01/20   [provider]  promethazine-dextromethorphan (PROMETHAZINE-DM) 6.25-15 MG/5ML syrup Take 5 mLs by mouth 4 (four) times daily as needed for cough. 01/08/21   Hans Eden, NP  telmisartan (MICARDIS) 40 MG tablet 1 tablet 09/06/20   [provider]    Family History Family History  Problem Relation Age of Onset   Diabetes Father     Social History Social History   Tobacco Use   Smoking status: Never   Smokeless tobacco: Never  Substance Use Topics   Alcohol use: No   Drug use: No     Allergies   Patient has no known allergies.   Review of Systems Review of Systems  Constitutional: Negative.   HENT:  Positive for congestion, rhinorrhea and sore throat.  Negative for dental problem, drooling, ear discharge, ear pain, facial swelling, hearing loss, mouth sores, nosebleeds, postnasal drip, sinus pressure, sinus pain, sneezing, tinnitus, trouble swallowing and voice change.   Respiratory:  Positive for cough. Negative for apnea, choking, chest tightness, shortness of breath, wheezing and stridor.   Gastrointestinal:  Positive for abdominal pain. Negative for abdominal distention, anal bleeding, blood in stool, constipation, diarrhea, nausea, rectal pain and vomiting.  Skin: Negative.   Neurological: Negative.      Physical Exam Triage Vital Signs ED Triage Vitals  Enc Vitals Group     BP 01/08/21 0927 (!) 162/81     Pulse Rate 01/08/21 0927 64     Resp 01/08/21 0927 20     Temp 01/08/21 0927 98.6 F (37 C)     Temp Source 01/08/21 0927 Oral     SpO2 01/08/21 0927 97 %     Weight --      Height --      Head Circumference --      Peak Flow --      Pain Score 01/08/21 0926 4     Pain Loc --      Pain Edu? --      Excl. in [place]? --    No data found.  Updated Vital Signs BP (!) 162/81 (BP Location: Left Arm)   Pulse 64   Temp 98.6 F (37 C) (Oral)   Resp 20   SpO2 97%   Visual Acuity Right Eye Distance:   Left Eye Distance:   Bilateral Distance:    Right Eye Near:   Left Eye Near:    Bilateral Near:     Physical Exam Constitutional:      Appearance: He is well-developed. He is obese.  HENT:     Head: Normocephalic.     Right Ear: Ear canal and external ear normal. A middle ear effusion is present.     Left Ear: Ear canal and external ear normal. A middle ear effusion is present.     Nose: Congestion and rhinorrhea present.     Mouth/Throat:     Mouth: Mucous membranes are moist.     Pharynx: Posterior oropharyngeal erythema present.  Eyes:     Extraocular Movements: Extraocular movements intact.  Cardiovascular:     Rate and Rhythm: Normal rate and regular rhythm.     Heart sounds: Normal heart sounds.  Pulmonary:     Effort: Pulmonary effort is normal.     Breath sounds: Normal breath sounds.  Abdominal:     General: Bowel sounds are normal.     Tenderness: There is no abdominal tenderness. There is no right CVA tenderness or left CVA tenderness.  Musculoskeletal:     Cervical back: Normal range of motion and neck supple.     Comments: Point tenderness at the third and fourth rib  Skin:    General: Skin is warm and dry.  Neurological:     Mental Status: He is alert and oriented to person, place, and time. Mental status is at baseline.  Psychiatric:         Mood and Affect: Mood normal.        Behavior: Behavior normal.     UC Treatments / Results  Labs (all labs ordered are listed, but only abnormal results are displayed) Labs Reviewed  SARS CORONAVIRUS 2 (TAT 6-24 HRS)    EKG   Radiology No results found.  Procedures Procedures (including critical care time)  Medications Ordered  in UC Medications - No data to display  Initial Impression / Assessment and Plan / UC Course  I have reviewed the triage vital signs and the nursing notes.  Pertinent labs & imaging results that were available during my care of the patient were reviewed by me and considered in my medical decision making (see chart for details).  Viral illness  Etiology of symptoms most likely viral, discussed with patient, because of abdominal pain is most likely muscular soreness due to coughing, discussed with patient in agreement with plan of care to treat conservatively and defer imaging at this time  1.  Ibuprofen 800 mg 3 times daily as needed 2.  Tessalon 100 mg 3 times daily as needed 3.  Promethazine DM 6.25-15 mg/5 mils every 4 hours as needed 4.  COVID test pending 5.  Over-the-counter medications and home remedies for remaining symptoms 6.  Patient given strict return precautions to follow-up in urgent care for persistent symptoms Final Clinical Impressions(s) / UC Diagnoses   Final diagnoses:  Viral illness     Discharge Instructions      We will contact you if your COVID test is positive.  Please quarantine while you wait for the results.  If your test is negative you may resume normal activities.  If your test is positive please continue to quarantine for at least 5 days from your symptom onset or until you are without a fever for at least 24 hours after the medications.    You may use ibuprofen up to 3 times a day for comfort  You can take the Tessalon pill every 8 hours as needed for coughing  You can use Promethazine DM every 4 hours as  needed for coughing, be mindful of this medication make you drowsy   For cough: honey 1/2 to 1 teaspoon (you can dilute the honey in water or another fluid).  You can also use guaifenesin  for cough. You can use a humidifier for chest congestion and cough.  If you don't have a humidifier, you can sit in the bathroom with the hot shower running.      For sore throat: try warm salt water gargles, cepacol lozenges, throat spray, warm tea or water with lemon/honey, popsicles or ice, or OTC cold relief medicine for throat discomfort.   For congestion: take a daily anti-histamine like Zyrtec, Claritin, and a oral decongestant, such as pseudoephedrine.  You can also use Flonase 1-2 sprays in each nostril daily.   It is important to stay hydrated: drink plenty of fluids (water, gatorade/powerade/pedialyte, juices, or teas) to keep your throat moisturized and help further relieve irritation/discomfort.     ED Prescriptions     Medication Sig Dispense Auth. Provider   benzonatate (TESSALON) 100 MG capsule  (Status: Discontinued) Take 1 capsule (100 mg total) by mouth every 8 (eight) hours. 21 capsule Nolton Denis R, NP   promethazine-dextromethorphan (PROMETHAZINE-DM) 6.25-15 MG/5ML syrup  (Status: Discontinued) Take 5 mLs by mouth 4 (four) times daily as needed for cough. 118 mL Wynona Duhamel R, NP   ibuprofen (ADVIL) 800 MG tablet  (Status: Discontinued) Take 1 tablet (800 mg total) by mouth 3 (three) times daily. 21 tablet Andersson Larrabee R, NP   benzonatate (TESSALON) 100 MG capsule Take 1 capsule (100 mg total) by mouth every 8 (eight) hours. 21 capsule Alven Alverio R, NP   ibuprofen (ADVIL) 800 MG tablet Take 1 tablet (800 mg total) by mouth 3 (three) times daily. 21 tablet [place], Vincente Liberty  R, NP   promethazine-dextromethorphan (PROMETHAZINE-DM) 6.25-15 MG/5ML syrup Take 5 mLs by mouth 4 (four) times daily as needed for cough. 118 mL Sabatino Williard, Leitha Schuller, NP      PDMP not reviewed this  encounter.   Hans Eden, NP 01/08/21 1021

## 2021-01-08 NOTE — Discharge Instructions (Addendum)
We will contact you if your COVID test is positive.  Please quarantine while you wait for the results.  If your test is negative you may resume normal activities.  If your test is positive please continue to quarantine for at least 5 days from your symptom onset or until you are without a fever for at least 24 hours after the medications.    You may use ibuprofen up to 3 times a day for comfort  You can take the Tessalon pill every 8 hours as needed for coughing  You can use Promethazine DM every 4 hours as needed for coughing, be mindful of this medication make you drowsy   For cough: honey 1/2 to 1 teaspoon (you can dilute the honey in water or another fluid).  You can also use guaifenesin  for cough. You can use a humidifier for chest congestion and cough.  If you don't have a humidifier, you can sit in the bathroom with the hot shower running.      For sore throat: try warm salt water gargles, cepacol lozenges, throat spray, warm tea or water with lemon/honey, popsicles or ice, or OTC cold relief medicine for throat discomfort.   For congestion: take a daily anti-histamine like Zyrtec, Claritin, and a oral decongestant, such as pseudoephedrine.  You can also use Flonase 1-2 sprays in each nostril daily.   It is important to stay hydrated: drink plenty of fluids (water, gatorade/powerade/pedialyte, juices, or teas) to keep your throat moisturized and help further relieve irritation/discomfort.

## 2021-01-22 ENCOUNTER — Other Ambulatory Visit: Payer: Self-pay

## 2021-01-22 NOTE — Patient Outreach (Signed)
[facility]) Care Management  [place]  01/22/2021   Miguel Boone November 10, 1950 [id number]  Subjective: Telephone call to patient for follow up. Patient doing well.  Diabetes management continues.  No concerns.    Objective:   Encounter Medications:  Outpatient Encounter Medications as of 01/22/2021  Medication Sig Note   benzonatate (TESSALON) 100 MG capsule Take 1 capsule (100 mg total) by mouth every 8 (eight) hours.    glucose blood (FREESTYLE TEST STRIPS) test strip Check glucoses twice daily. Once in the morning before breakfast and once in the evening before dinner.    ibuprofen (ADVIL) 800 MG tablet Take 1 tablet (800 mg total) by mouth 3 (three) times daily.    insulin aspart protamine - aspart (NOVOLOG 70/30 MIX) (70-30) 100 UNIT/ML FlexPen Inject 0.6 mLs (60 Units total) into the skin daily with breakfast. 10/17/2020: Taking 50 units in am.   insulin aspart protamine - aspart (NOVOLOG 70/30 MIX) (70-30) 100 UNIT/ML FlexPen Inject 0.5 mLs (50 Units total) into the skin daily with supper.    Insulin Pen Needle (PEN NEEDLES) 31G X 5 MM MISC 1 each by Does not apply route in the morning and at bedtime.    Lancets (FREESTYLE) lancets Check glucoses twice daily. Once in the morning before breakfast and once in the evening before dinner.    metFORMIN (GLUCOPHAGE-XR) 500 MG 24 hr tablet 1 tablet with evening meal    promethazine-dextromethorphan (PROMETHAZINE-DM) 6.25-15 MG/5ML syrup Take 5 mLs by mouth 4 (four) times daily as needed for cough.    telmisartan (MICARDIS) 40 MG tablet 1 tablet    No facility-administered encounter medications on file as of 01/22/2021.    Functional Status:  In your present state of health, do you have any difficulty performing the following activities: 07/09/2020 04/30/2020  Hearing? N N  Vision? Y Y  Comment blurry blurry  Difficulty concentrating or making decisions? N N  Walking or climbing stairs? N N  Dressing or  bathing? N N  Doing errands, shopping? N N  Preparing Food and eating ? N N  Using the Toilet? N N  In the past six months, have you accidently leaked urine? N N  Do you have problems with loss of bowel control? N N  Managing your Medications? N N  Managing your Finances? N N  Housekeeping or managing your Housekeeping? N N  Some recent data might be hidden    Fall/Depression Screening: Fall Risk  10/17/2020 07/09/2020 04/30/2020  Falls in the past year? 0 0 0  Number falls in past yr: - - 0  Injury with Fall? - - 0   PHQ 2/9 Scores 07/09/2020 04/30/2020  PHQ - 2 Score 0 0    Assessment:   Care Plan Care Plan : Diabetes Type 2 (Adult)  Updates made by Jon Billings, RN since 01/22/2021 12:00 AM     Problem: Glycemic Management (Diabetes, Type 2)      Long-Range Goal: Glycemic Management Optimized as evidenced by A1c less than 8.   Start Date: 04/30/2020  Expected End Date: 10/25/2021  This Visit's Progress: On track  Recent Progress: On track  Priority: High  Note:   Evidence-based guidance:  Promote self-monitoring of blood glucose levels.  Assess and address barriers to management plan, such as food insecurity, age,  developmental ability, depression, anxiety, fear of hypoglycemia or weight gain,  as well as medication cost, side effects and complicated regimen.  Notes:     Task:  Alleviate Barriers to Glycemic Management   Due Date: 10/25/2021  Priority: Routine  Responsible User: Jon Billings, RN  Note:   Care Management Activities:    - blood glucose readings reviewed - self-awareness of signs/symptoms of hypo or hyperglycemia encouraged - use of blood glucose monitoring log promoted    Notes: 07/09/20 Blood sugar range 80-115 per patient. 10/17/20 Patient blood sugar 115 this am but did have a drop last night due to a light meal.  Discussed carbohydrate counting and making sure he has enough carbohydrates on board.  Diabetes Management Discussed: Medication  adherence Reviewed importance of limiting carbs such as rice, potatoes, breads, and pastas. Also discussed limiting sweets and sugary drinks.  Discussed importance of portion control.   09/20/20 Patient A1c 6.4.  Discussed continued diabetes management.  11/08/20 Patient reports doing ok but sugar ran high after eating a a broccoli and rice casserole.  Highest sugar was 167.  Reiterated diabetes management.    12/20/20 Patient blood sugar range 125-130. Continued Diabetes management encouraged. 01/22/21 Patient blood sugar ranges 85-140.  Reiterated diabetes management.       Goals Addressed               This Visit's Progress     THN-Monitor and Manage My Blood Sugar-Diabetes Type 2 (pt-stated)   On track     Barriers: Health Behaviors Knowledge Timeframe:  Long-Range Goal Priority:  High Start Date:  04/30/20                           Expected End Date:    10/25/21  Follow Up Date 02/25/21  - check blood sugar at prescribed times - check blood sugar if I feel it is too high or too low - take the blood sugar log to all doctor visits    Why is this important?   Checking your blood sugar at home helps to keep it from getting very high or very low.  Writing the results in a diary or log helps the doctor know how to care for you.  Your blood sugar log should have the time, date and the results.  Also, write down the amount of insulin or other medicine that you take.  Other information, like what you ate, exercise done and how you were feeling, will also be helpful.     Notes: 05/31/20 Patient sugars ranging 97-123.  Lost 13 lbs. 07/09/20 sugar ranges 80-115 09/20/20 Patient sugar report around 125. A1c 6.4.  Keep up the great work!! Remain active.   10/17/20 Patient sugar report 115 this am.  Discussed carbohydrate in take to sustain sugars. Diabetes Management Discussed: Medication adherence Reviewed importance of limiting carbs such as rice, potatoes, breads, and pastas. Also  discussed limiting sweets and sugary drinks.  Discussed importance of portion control.   11/08/20 Patient reports doing ok but sugar ran high after eating a a broccoli and rice casserole.  Highest sugar was 167.  Reiterated diabetes management.      12/20/20 Continued diabetes management encouraged. Blood sugar ranges 125-130.  Keep up the great work!! 01/22/21 Blood sugar ranges 85-140.  Reiterated diabetes management.  Keep up the great work!!        Plan:  Follow-up: Patient agrees to Care Plan and Follow-up. Follow-up in 4-6 week(s)  Jone Baseman, RN, MSN [place] Management Care Management Coordinator Direct Line [phone] Cell [phone] Toll Free: [phone]  Fax: [phone]

## 2021-01-22 NOTE — Patient Instructions (Signed)
Goals       THN-Monitor and Manage My Blood Sugar-Diabetes Type 2 (pt-stated)      Barriers: Health Behaviors Knowledge Timeframe:  Long-Range Goal Priority:  High Start Date:  04/30/20                           Expected End Date:    10/25/21  Follow Up Date 02/25/21  - check blood sugar at prescribed times - check blood sugar if I feel it is too high or too low - take the blood sugar log to all doctor visits    Why is this important?   Checking your blood sugar at home helps to keep it from getting very high or very low.  Writing the results in a diary or log helps the doctor know how to care for you.  Your blood sugar log should have the time, date and the results.  Also, write down the amount of insulin or other medicine that you take.  Other information, like what you ate, exercise done and how you were feeling, will also be helpful.     Notes: 05/31/20 Patient sugars ranging 97-123.  Lost 13 lbs. 07/09/20 sugar ranges 80-115 09/20/20 Patient sugar report around 125. A1c 6.4.  Keep up the great work!! Remain active.   10/17/20 Patient sugar report 115 this am.  Discussed carbohydrate in take to sustain sugars. Diabetes Management Discussed: Medication adherence Reviewed importance of limiting carbs such as rice, potatoes, breads, and pastas. Also discussed limiting sweets and sugary drinks.  Discussed importance of portion control.   11/08/20 Patient reports doing ok but sugar ran high after eating a a broccoli and rice casserole.  Highest sugar was 167.  Reiterated diabetes management.      12/20/20 Continued diabetes management encouraged. Blood sugar ranges 125-130.  Keep up the great work!! 01/22/21 Blood sugar ranges 85-140.  Reiterated diabetes management.  Keep up the great work!!

## 2021-01-24 ENCOUNTER — Ambulatory Visit: Payer: Self-pay

## 2021-02-18 ENCOUNTER — Other Ambulatory Visit: Payer: Self-pay

## 2021-02-18 NOTE — Patient Outreach (Signed)
[facility]) Care Management  02/18/2021  [place] 11/28/1950 [id number]   Telephone call to patient for disease management follow up.   No answer.  HIPAA compliant voice message left.    Plan: If no return call, RN CM will attempt patient again in December.   Jone Baseman, RN, MSN [place] Management Care Management Coordinator Direct Line [phone] Cell [phone] Toll Free: [phone]  Fax: [phone]

## 2021-02-19 ENCOUNTER — Ambulatory Visit: Payer: Self-pay

## 2021-02-19 ENCOUNTER — Other Ambulatory Visit: Payer: Self-pay

## 2021-02-19 NOTE — Patient Outreach (Signed)
[facility]) Care Management  [place]  02/19/2021   Miguel Boone 1950-07-24 [id number]  Subjective: Telephone call to patient for follow up. Patient doing good. Diabetes management continues. No concerns.   Objective:   Encounter Medications:  Outpatient Encounter Medications as of 02/19/2021  Medication Sig Note   benzonatate (TESSALON) 100 MG capsule Take 1 capsule (100 mg total) by mouth every 8 (eight) hours.    glucose blood (FREESTYLE TEST STRIPS) test strip Check glucoses twice daily. Once in the morning before breakfast and once in the evening before dinner.    ibuprofen (ADVIL) 800 MG tablet Take 1 tablet (800 mg total) by mouth 3 (three) times daily.    insulin aspart protamine - aspart (NOVOLOG 70/30 MIX) (70-30) 100 UNIT/ML FlexPen Inject 0.6 mLs (60 Units total) into the skin daily with breakfast. 10/17/2020: Taking 50 units in am.   insulin aspart protamine - aspart (NOVOLOG 70/30 MIX) (70-30) 100 UNIT/ML FlexPen Inject 0.5 mLs (50 Units total) into the skin daily with supper.    Insulin Pen Needle (PEN NEEDLES) 31G X 5 MM MISC 1 each by Does not apply route in the morning and at bedtime.    Lancets (FREESTYLE) lancets Check glucoses twice daily. Once in the morning before breakfast and once in the evening before dinner.    metFORMIN (GLUCOPHAGE-XR) 500 MG 24 hr tablet 1 tablet with evening meal    promethazine-dextromethorphan (PROMETHAZINE-DM) 6.25-15 MG/5ML syrup Take 5 mLs by mouth 4 (four) times daily as needed for cough.    telmisartan (MICARDIS) 40 MG tablet 1 tablet    No facility-administered encounter medications on file as of 02/19/2021.    Functional Status:  In your present state of health, do you have any difficulty performing the following activities: 07/09/2020 04/30/2020  Hearing? N N  Vision? Y Y  Comment blurry blurry  Difficulty concentrating or making decisions? N N  Walking or climbing stairs? N N  Dressing or  bathing? N N  Doing errands, shopping? N N  Preparing Food and eating ? N N  Using the Toilet? N N  In the past six months, have you accidently leaked urine? N N  Do you have problems with loss of bowel control? N N  Managing your Medications? N N  Managing your Finances? N N  Housekeeping or managing your Housekeeping? N N  Some recent data might be hidden    Fall/Depression Screening: Fall Risk  10/17/2020 07/09/2020 04/30/2020  Falls in the past year? 0 0 0  Number falls in past yr: - - 0  Injury with Fall? - - 0   PHQ 2/9 Scores 07/09/2020 04/30/2020  PHQ - 2 Score 0 0    Assessment:   Care Plan Care Plan : Diabetes Type 2 (Adult)  Updates made by Jon Billings, RN since 02/19/2021 12:00 AM     Problem: Glycemic Management (Diabetes, Type 2)      Long-Range Goal: Glycemic Management Optimized as evidenced by A1c less than 8.   Start Date: 04/30/2020  Expected End Date: 10/25/2021  This Visit's Progress: On track  Recent Progress: On track  Priority: High  Note:   Evidence-based guidance:  Promote self-monitoring of blood glucose levels.  Assess and address barriers to management plan, such as food insecurity, age,  developmental ability, depression, anxiety, fear of hypoglycemia or weight gain,  as well as medication cost, side effects and complicated regimen.  Notes:     Task: Alleviate Barriers to  Glycemic Management   Due Date: 10/25/2021  Priority: Routine  Responsible User: Jon Billings, RN  Note:   Care Management Activities:    - blood glucose readings reviewed - self-awareness of signs/symptoms of hypo or hyperglycemia encouraged - use of blood glucose monitoring log promoted    Notes: 07/09/20 Blood sugar range 80-115 per patient. 10/17/20 Patient blood sugar 115 this am but did have a drop last night due to a light meal.  Discussed carbohydrate counting and making sure he has enough carbohydrates on board.  Diabetes Management Discussed: Medication  adherence Reviewed importance of limiting carbs such as rice, potatoes, breads, and pastas. Also discussed limiting sweets and sugary drinks.  Discussed importance of portion control.   09/20/20 Patient A1c 6.4.  Discussed continued diabetes management.  11/08/20 Patient reports doing ok but sugar ran high after eating a a broccoli and rice casserole.  Highest sugar was 167.  Reiterated diabetes management.    12/20/20 Patient blood sugar range 125-130. Continued Diabetes management encouraged. 01/22/21 Patient blood sugar ranges 85-140.  Reiterated diabetes management.  02/19/21 Blood sugar ranges in 90-130.  Discussed diabetes management       Goals Addressed               This Visit's Progress     THN-Monitor and Manage My Blood Sugar-Diabetes Type 2 (pt-stated)   On track     Barriers: Health Behaviors Knowledge Timeframe:  Long-Range Goal Priority:  High Start Date:  04/30/20                           Expected End Date:    10/25/21  Follow Up Date 04/27/21  - check blood sugar at prescribed times - check blood sugar if I feel it is too high or too low - take the blood sugar log to all doctor visits    Why is this important?   Checking your blood sugar at home helps to keep it from getting very high or very low.  Writing the results in a diary or log helps the doctor know how to care for you.  Your blood sugar log should have the time, date and the results.  Also, write down the amount of insulin or other medicine that you take.  Other information, like what you ate, exercise done and how you were feeling, will also be helpful.     Notes: 05/31/20 Patient sugars ranging 97-123.  Lost 13 lbs. 07/09/20 sugar ranges 80-115 09/20/20 Patient sugar report around 125. A1c 6.4.  Keep up the great work!! Remain active.   10/17/20 Patient sugar report 115 this am.  Discussed carbohydrate in take to sustain sugars. Diabetes Management Discussed: Medication adherence Reviewed importance of  limiting carbs such as rice, potatoes, breads, and pastas. Also discussed limiting sweets and sugary drinks.  Discussed importance of portion control.   11/08/20 Patient reports doing ok but sugar ran high after eating a a broccoli and rice casserole.  Highest sugar was 167.  Reiterated diabetes management.      12/20/20 Continued diabetes management encouraged. Blood sugar ranges 125-130.  Keep up the great work!! 01/22/21 Blood sugar ranges 85-140.  Reiterated diabetes management.  Keep up the great work!! 02/19/21 Keep up the great work!! Blood sugars ranging 90-130.  Discussed ongoing diabetes management.         Plan:  Follow-up: Patient agrees to Care Plan and Follow-up. Follow-up in 2 month(s)  Antonella Upson J  Dia Sitter RN, MSN [place] Management Care Management Coordinator Direct Line [phone] Cell [phone] Toll Free: [phone]  Fax: [phone]

## 2021-02-27 DIAGNOSIS — I10 Essential (primary) hypertension: Secondary | ICD-10-CM | POA: Diagnosis not present

## 2021-02-27 DIAGNOSIS — Z7984 Long term (current) use of oral hypoglycemic drugs: Secondary | ICD-10-CM | POA: Diagnosis not present

## 2021-02-27 DIAGNOSIS — E1121 Type 2 diabetes mellitus with diabetic nephropathy: Secondary | ICD-10-CM | POA: Diagnosis not present

## 2021-03-08 DIAGNOSIS — I10 Essential (primary) hypertension: Secondary | ICD-10-CM | POA: Diagnosis not present

## 2021-03-08 DIAGNOSIS — E1121 Type 2 diabetes mellitus with diabetic nephropathy: Secondary | ICD-10-CM | POA: Diagnosis not present

## 2021-03-08 DIAGNOSIS — E785 Hyperlipidemia, unspecified: Secondary | ICD-10-CM | POA: Diagnosis not present

## 2021-03-29 ENCOUNTER — Other Ambulatory Visit: Payer: Self-pay

## 2021-03-29 NOTE — Patient Outreach (Signed)
[facility]) Care Management  [place]  03/29/2021   Miguel Boone 1950-07-24 [id number]  Subjective: Telephone call to patient for follow up.  He reports doing well. Blood sugars running 100-145.  Diabetes management continues.  No concerns.   Objective:   Encounter Medications:  Outpatient Encounter Medications as of 03/29/2021  Medication Sig Note   benzonatate (TESSALON) 100 MG capsule Take 1 capsule (100 mg total) by mouth every 8 (eight) hours.    glucose blood (FREESTYLE TEST STRIPS) test strip Check glucoses twice daily. Once in the morning before breakfast and once in the evening before dinner.    ibuprofen (ADVIL) 800 MG tablet Take 1 tablet (800 mg total) by mouth 3 (three) times daily.    insulin aspart protamine - aspart (NOVOLOG 70/30 MIX) (70-30) 100 UNIT/ML FlexPen Inject 0.6 mLs (60 Units total) into the skin daily with breakfast. 10/17/2020: Taking 50 units in am.   insulin aspart protamine - aspart (NOVOLOG 70/30 MIX) (70-30) 100 UNIT/ML FlexPen Inject 0.5 mLs (50 Units total) into the skin daily with supper.    Insulin Pen Needle (PEN NEEDLES) 31G X 5 MM MISC 1 each by Does not apply route in the morning and at bedtime.    Lancets (FREESTYLE) lancets Check glucoses twice daily. Once in the morning before breakfast and once in the evening before dinner.    metFORMIN (GLUCOPHAGE-XR) 500 MG 24 hr tablet 1 tablet with evening meal    promethazine-dextromethorphan (PROMETHAZINE-DM) 6.25-15 MG/5ML syrup Take 5 mLs by mouth 4 (four) times daily as needed for cough.    telmisartan (MICARDIS) 40 MG tablet 1 tablet    No facility-administered encounter medications on file as of 03/29/2021.    Functional Status:  In your present state of health, do you have any difficulty performing the following activities: 07/09/2020 04/30/2020  Hearing? N N  Vision? Y Y  Comment blurry blurry  Difficulty concentrating or making decisions? N N  Walking or  climbing stairs? N N  Dressing or bathing? N N  Doing errands, shopping? N N  Preparing Food and eating ? N N  Using the Toilet? N N  In the past six months, have you accidently leaked urine? N N  Do you have problems with loss of bowel control? N N  Managing your Medications? N N  Managing your Finances? N N  Housekeeping or managing your Housekeeping? N N  Some recent data might be hidden    Fall/Depression Screening: Fall Risk  10/17/2020 07/09/2020 04/30/2020  Falls in the past year? 0 0 0  Number falls in past yr: - - 0  Injury with Fall? - - 0   PHQ 2/9 Scores 07/09/2020 04/30/2020  PHQ - 2 Score 0 0    Assessment:   Care Plan Care Plan : General Plan of Care (Adult)  Updates made by Jon Billings, RN since 03/29/2021 12:00 AM  Completed 03/29/2021   Problem: Therapeutic Alliance (General Plan of Care) Resolved 03/29/2021     Care Plan : Diabetes Type 2 (Adult)  Updates made by Jon Billings, RN since 03/29/2021 12:00 AM  Completed 03/29/2021   Problem: Glycemic Management (Diabetes, Type 2) Resolved 03/29/2021     Long-Range Goal: Glycemic Management Optimized as evidenced by A1c less than 8. Completed 03/29/2021  Start Date: 04/30/2020  Expected End Date: 10/25/2021  Recent Progress: On track  Priority: High  Note:   Evidence-based guidance:  Promote self-monitoring of blood glucose levels.  Assess and  address barriers to management plan, such as food insecurity, age,  developmental ability, depression, anxiety, fear of hypoglycemia or weight gain,  as well as medication cost, side effects and complicated regimen.  Notes: 54/6/50 Resolving duplicate goal    Task: Alleviate Barriers to Glycemic Management Completed 03/29/2021  Due Date: 10/25/2021  Priority: Routine  Responsible User: Jon Billings, RN  Note:   Care Management Activities:    - blood glucose readings reviewed - self-awareness of signs/symptoms of hypo or hyperglycemia encouraged - use of blood  glucose monitoring log promoted    Notes: 07/09/20 Blood sugar range 80-115 per patient. 10/17/20 Patient blood sugar 115 this am but did have a drop last night due to a light meal.  Discussed carbohydrate counting and making sure he has enough carbohydrates on board.  Diabetes Management Discussed: Medication adherence Reviewed importance of limiting carbs such as rice, potatoes, breads, and pastas. Also discussed limiting sweets and sugary drinks.  Discussed importance of portion control.   09/20/20 Patient A1c 6.4.  Discussed continued diabetes management.  11/08/20 Patient reports doing ok but sugar ran high after eating a a broccoli and rice casserole.  Highest sugar was 167.  Reiterated diabetes management.    12/20/20 Patient blood sugar range 125-130. Continued Diabetes management encouraged. 01/22/21 Patient blood sugar ranges 85-140.  Reiterated diabetes management.  02/19/21 Blood sugar ranges in 90-130.  Discussed diabetes management     Problem: Disease Progression (Diabetes, Type 2) Resolved 03/29/2021     Care Plan : RN Care Manager Plan of Care  Updates made by Jon Billings, RN since 03/29/2021 12:00 AM     Problem: Chronic Disease Management and Care Coordination Needs Diabetes   Priority: High     Long-Range Goal: Development of Plan of Care for Management of Diabetes   Start Date: 03/29/2021  Expected End Date: 04/26/2022  Priority: High  Note:   Current Barriers:  Chronic Disease Management support and education needs related to DMII   RNCM Clinical Goal(s):  Patient will verbalize basic understanding of  DMII disease process and self health management plan as evidenced by A1c less than 7  through collaboration with RN Care manager, provider, and care team.   Interventions: Education and support related to diabetes Inter-disciplinary care team collaboration (see longitudinal plan of care) Evaluation of current treatment plan related to  self management and  patient's adherence to plan as established by provider   Diabetes Interventions:  (Status:  New goal.) Long Term Goal Assessed patient's understanding of A1c goal: <7% Provided education to patient about basic DM disease process Counseled on importance of regular laboratory monitoring as prescribed Discussed plans with patient for ongoing care management follow up and provided patient with direct contact information for care management team Lab Results  Component Value Date   HGBA1C 14.3 (H) 04/24/2020  03/29/21 Blood sugars ranging 100-145.  A1c 6.2(02/27/21) Diabetes Management Discussed: Medication adherence Reviewed importance of limiting carbs such as rice, potatoes, breads, and pastas. Also discussed limiting sweets and sugary drinks.  Discussed importance of portion control.  Also discussed importance of annual exams, foot exams, and eye exams.    Patient Goals/Self-Care Activities: Take all medications as prescribed Call provider office for new concerns or questions  check blood sugar at prescribed times: twice daily take the blood sugar log to all doctor visits  Follow Up Plan:  Telephone follow up appointment with care management team member scheduled for:  February The patient has been provided with contact information  for the care management team and has been advised to call with any health related questions or concerns.        Goals Addressed               This Visit's Progress     COMPLETED: [facility]-Monitor and Manage My Blood Sugar-Diabetes Type 2 (pt-stated)        Barriers: Health Behaviors Knowledge Timeframe:  Long-Range Goal Priority:  High Start Date:  04/30/20                           Expected End Date:    10/25/21  Follow Up Date 04/27/21  - check blood sugar at prescribed times - check blood sugar if I feel it is too high or too low - take the blood sugar log to all doctor visits    Why is this important?   Checking your blood sugar at home helps to  keep it from getting very high or very low.  Writing the results in a diary or log helps the doctor know how to care for you.  Your blood sugar log should have the time, date and the results.  Also, write down the amount of insulin or other medicine that you take.  Other information, like what you ate, exercise done and how you were feeling, will also be helpful.     Notes: 05/31/20 Patient sugars ranging 97-123.  Lost 13 lbs. 07/09/20 sugar ranges 80-115 09/20/20 Patient sugar report around 125. A1c 6.4.  Keep up the great work!! Remain active.   10/17/20 Patient sugar report 115 this am.  Discussed carbohydrate in take to sustain sugars. Diabetes Management Discussed: Medication adherence Reviewed importance of limiting carbs such as rice, potatoes, breads, and pastas. Also discussed limiting sweets and sugary drinks.  Discussed importance of portion control.   11/08/20 Patient reports doing ok but sugar ran high after eating a a broccoli and rice casserole.  Highest sugar was 167.  Reiterated diabetes management.      12/20/20 Continued diabetes management encouraged. Blood sugar ranges 125-130.  Keep up the great work!! 01/22/21 Blood sugar ranges 85-140.  Reiterated diabetes management.  Keep up the great work!! 02/19/21 Keep up the great work!! Blood sugars ranging 90-130.  Discussed ongoing diabetes management.  37/0/48 Resolving duplicate goal        Plan:  Follow-up: Patient agrees to Care Plan and Follow-up. Follow-up in 2 month(s)  Jone Baseman, RN, MSN [place] [facility] Care Management Coordinator Direct Line [phone] Cell [phone] Toll Free: [phone]  Fax: [phone]

## 2021-03-29 NOTE — Patient Instructions (Signed)
Patient Goals/Self-Care Activities: Diabetes Take all medications as prescribed Call provider office for new concerns or questions  check blood sugar at prescribed times: twice daily take the blood sugar log to all doctor visits

## 2021-04-05 DIAGNOSIS — E1121 Type 2 diabetes mellitus with diabetic nephropathy: Secondary | ICD-10-CM | POA: Diagnosis not present

## 2021-04-05 DIAGNOSIS — E785 Hyperlipidemia, unspecified: Secondary | ICD-10-CM | POA: Diagnosis not present

## 2021-04-05 DIAGNOSIS — I10 Essential (primary) hypertension: Secondary | ICD-10-CM | POA: Diagnosis not present

## 2021-04-30 DIAGNOSIS — Z800 Family history of malignant neoplasm of digestive organs: Secondary | ICD-10-CM | POA: Diagnosis not present

## 2021-04-30 DIAGNOSIS — R195 Other fecal abnormalities: Secondary | ICD-10-CM | POA: Diagnosis not present

## 2021-05-03 DIAGNOSIS — E1121 Type 2 diabetes mellitus with diabetic nephropathy: Secondary | ICD-10-CM | POA: Diagnosis not present

## 2021-05-03 DIAGNOSIS — I10 Essential (primary) hypertension: Secondary | ICD-10-CM | POA: Diagnosis not present

## 2021-05-03 DIAGNOSIS — E785 Hyperlipidemia, unspecified: Secondary | ICD-10-CM | POA: Diagnosis not present

## 2021-05-07 DIAGNOSIS — K635 Polyp of colon: Secondary | ICD-10-CM | POA: Diagnosis not present

## 2021-05-07 DIAGNOSIS — Z1211 Encounter for screening for malignant neoplasm of colon: Secondary | ICD-10-CM | POA: Diagnosis not present

## 2021-05-07 DIAGNOSIS — R195 Other fecal abnormalities: Secondary | ICD-10-CM | POA: Diagnosis not present

## 2021-05-07 DIAGNOSIS — E119 Type 2 diabetes mellitus without complications: Secondary | ICD-10-CM | POA: Diagnosis not present

## 2021-05-13 DIAGNOSIS — K635 Polyp of colon: Secondary | ICD-10-CM | POA: Diagnosis not present

## 2021-05-31 DIAGNOSIS — Z7984 Long term (current) use of oral hypoglycemic drugs: Secondary | ICD-10-CM | POA: Diagnosis not present

## 2021-05-31 DIAGNOSIS — E1165 Type 2 diabetes mellitus with hyperglycemia: Secondary | ICD-10-CM | POA: Diagnosis not present

## 2021-05-31 DIAGNOSIS — E1121 Type 2 diabetes mellitus with diabetic nephropathy: Secondary | ICD-10-CM | POA: Diagnosis not present

## 2021-05-31 DIAGNOSIS — I10 Essential (primary) hypertension: Secondary | ICD-10-CM | POA: Diagnosis not present

## 2021-05-31 DIAGNOSIS — E785 Hyperlipidemia, unspecified: Secondary | ICD-10-CM | POA: Diagnosis not present

## 2021-06-19 ENCOUNTER — Other Ambulatory Visit: Payer: Self-pay

## 2021-06-19 DIAGNOSIS — D369 Benign neoplasm, unspecified site: Secondary | ICD-10-CM | POA: Diagnosis not present

## 2021-06-19 NOTE — Patient Outreach (Signed)
[facility]) Care Management  06/19/2021  [place] December 05, 1950 [id number]   Telephone call to patient for disease management follow up.   No answer.  HIPAA compliant voice message left.    Plan: If no return call, RN CM will attempt patient again  in May.  Jone Baseman, RN, MSN [facility] Care Management Care Management Coordinator Direct Line [phone] Toll Free: [phone]  Fax: [phone]

## 2021-06-19 NOTE — Patient Outreach (Signed)
[facility]) Care Management  06/19/2021  [place] 26-Jun-1950 [id number]   Telephone call to patient for diabetes follow up. Patient doing well.  A1c 6.5 and latest blood sugar 120. Patient scheduled for a colonoscopy on Friday.  Reiterated diabetes management.  No concerns.    Patient Care Plan: General Plan of Care (Adult)  Completed 03/29/2021   Problem Identified: Therapeutic Alliance (General Plan of Care) Resolved 03/29/2021     Goal: Therapeutic Alliance Established Completed 07/09/2020  Start Date: 04/30/2020  Expected End Date: 06/25/2020  Recent Progress: On track  Priority: High  Note:   Evidence-based guidance:  Avoid value judgments; convey acceptance.  Encourage collaboration with the treatment team.  Establish rapport; develop trust relationship.  Therapist, art.  Provide emotional support; encourage patient to share feelings of anger, fear and anxiety.  Promote self-reliance and autonomy based on age and ability; discourage overprotection.  Use empathy and nonjudgmental, participatory manner.   Notes:     Task: Develop Relationship to Effect Behavior Change Completed 07/09/2020  Due Date: 06/25/2020  Priority: Routine  Responsible User: Jon Billings, RN  Note:   Care Management Activities:    - acceptance conveyed - care explained - collaboration with team encouraged - emotional support provided - questions answered - questions encouraged - rapport fostered - reassurance provided - self-reliance encouraged - verbalization of feelings encouraged    Notes:     Patient Care Plan: Diabetes Type 2 (Adult)  Completed 03/29/2021   Problem Identified: Glycemic Management (Diabetes, Type 2) Resolved 03/29/2021     Long-Range Goal: Glycemic Management Optimized as evidenced by A1c less than 8. Completed 03/29/2021  Start Date: 04/30/2020  Expected End Date: 10/25/2021  Recent Progress: On track  Priority: High  Note:    Evidence-based guidance:  Promote self-monitoring of blood glucose levels.  Assess and address barriers to management plan, such as food insecurity, age,  developmental ability, depression, anxiety, fear of hypoglycemia or weight gain,  as well as medication cost, side effects and complicated regimen.  Notes: 39/7/67 Resolving duplicate goal    Task: Alleviate Barriers to Glycemic Management Completed 03/29/2021  Due Date: 10/25/2021  Priority: Routine  Responsible User: Jon Billings, RN  Note:   Care Management Activities:    - blood glucose readings reviewed - self-awareness of signs/symptoms of hypo or hyperglycemia encouraged - use of blood glucose monitoring log promoted    Notes: 07/09/20 Blood sugar range 80-115 per patient. 10/17/20 Patient blood sugar 115 this am but did have a drop last night due to a light meal.  Discussed carbohydrate counting and making sure he has enough carbohydrates on board.  Diabetes Management Discussed: Medication adherence Reviewed importance of limiting carbs such as rice, potatoes, breads, and pastas. Also discussed limiting sweets and sugary drinks.  Discussed importance of portion control.   09/20/20 Patient A1c 6.4.  Discussed continued diabetes management.  11/08/20 Patient reports doing ok but sugar ran high after eating a a broccoli and rice casserole.  Highest sugar was 167.  Reiterated diabetes management.    12/20/20 Patient blood sugar range 125-130. Continued Diabetes management encouraged. 01/22/21 Patient blood sugar ranges 85-140.  Reiterated diabetes management.  02/19/21 Blood sugar ranges in 90-130.  Discussed diabetes management     Problem Identified: Disease Progression (Diabetes, Type 2) Resolved 03/29/2021     Long-Range Goal: Disease Progression Prevented or Minimized Completed 07/09/2020  Start Date: 04/30/2020  Expected End Date: 06/25/2020  Recent Progress: On track  Priority:  High  Note:   Evidence-based guidance:   Prepare patient for laboratory and diagnostic exams based on risk and presentation.  Encourage lifestyle changes, such as increased intake of plant-based foods, stress reduction, consistent physical activity and smoking cessation to prevent long-term complications and chronic disease.   Individualize activity and exercise recommendations while considering potential limitations, such as neuropathy, retinopathy or the ability to prevent hyperglycemia or hypoglycemia.   Prepare patient for use of pharmacologic therapy that may include antihypertensive, analgesic, prostaglandin E1 with periodic adjustments, based on presenting chronic condition and laboratory results.  Assess signs/symptoms and risk factors for hypertension, sleep-disordered breathing, neuropathy (including changes in gait and balance), retinopathy, nephropathy and sexual dysfunction.  Address pregnancy planning and contraceptive choice, especially when prescribing antihypertensive or statin.  Ensure completion of annual comprehensive foot exam and dilated eye exam.   Implement additional individualized goals and interventions based on identified risk factors.  Prepare patient for consultation or referral for specialist care, such as ophthalmology, neurology, cardiology, podiatry, nephrology or perinatology.   Notes:     Task: Monitor and Manage Follow-up for Comorbidities Completed 07/09/2020  Due Date: 06/25/2020  Priority: Routine  Responsible User: Jon Billings, RN  Note:   Care Management Activities:    - healthy lifestyle promoted - signs/symptoms of comorbidities identified - vital signs and trends reviewed    Notes:     Patient Care Plan: RN Care Manager Plan of Care     Problem Identified: Chronic Disease Management and Care Coordination Needs Diabetes   Priority: High     Long-Range Goal: Development of Plan of Care for Management of Diabetes   Start Date: 03/29/2021  Expected End Date: 04/26/2022  This  Visit's Progress: On track  Priority: High  Note:   Current Barriers:  Chronic Disease Management support and education needs related to DMII   RNCM Clinical Goal(s):  Patient will verbalize basic understanding of  DMII disease process and self health management plan as evidenced by A1c less than 7  through collaboration with RN Care manager, provider, and care team.   Interventions: Education and support related to diabetes management Inter-disciplinary care team collaboration (see longitudinal plan of care) Evaluation of current treatment plan related to  self management and patient's adherence to plan as established by provider   Diabetes Interventions:  (Status:  Goal on track:  Yes.) Long Term Goal Assessed patient's understanding of A1c goal: <7% Provided education to patient about basic DM disease process Counseled on importance of regular laboratory monitoring as prescribed Discussed plans with patient for ongoing care management follow up and provided patient with direct contact information for care management team Lab Results  Component Value Date   HGBA1C 14.3 (H) 04/24/2020  06/19/21 Patient reports sugars doing well. Last A1c 6.5.  Encouraged patient to keep up the great work.  Reiterated diabetes management.     Diabetes Management Discussed: Medication adherence Reviewed importance of limiting carbs such as rice, potatoes, breads, and pastas. Also discussed limiting sweets and sugary drinks.  Discussed importance of portion control.  Also discussed importance of annual exams, foot exams, and eye exams.    Patient Goals/Self-Care Activities: Take all medications as prescribed Call provider office for new concerns or questions  check blood sugar at prescribed times: twice daily take the blood sugar log to all doctor visits  Follow Up Plan:  Telephone follow up appointment with care management team member scheduled for:  April The patient has been provided with contact  information  for the care management team and has been advised to call with any health related questions or concerns.      Plan: RN CM will contact patient in the month of April.   Patient agrees to follow-up and careplan.   Jone Baseman, RN, MSN [facility] Care Management Care Management Coordinator Direct Line [phone] Toll Free: [phone]  Fax: [phone]

## 2021-06-19 NOTE — Patient Instructions (Signed)
Patient Goals/Self-Care Activities: Take all medications as prescribed Call provider office for new concerns or questions  check blood sugar at prescribed times: twice daily take the blood sugar log to all doctor visits

## 2021-06-25 DIAGNOSIS — N481 Balanitis: Secondary | ICD-10-CM | POA: Diagnosis not present

## 2021-06-25 DIAGNOSIS — R309 Painful micturition, unspecified: Secondary | ICD-10-CM | POA: Diagnosis not present

## 2021-07-29 DIAGNOSIS — Z0130 Encounter for examination of blood pressure without abnormal findings: Secondary | ICD-10-CM | POA: Diagnosis not present

## 2021-08-06 ENCOUNTER — Other Ambulatory Visit: Payer: Self-pay

## 2021-08-06 NOTE — Patient Outreach (Signed)
[facility]) Care Management ? ?08/06/2021 ? ?Miguel Boone ?1950/06/11 ?[id number] ? ? ?Telephone call to patient for disease management follow up. He reports having some blood pressure elevation.  Picked up HCTZ today to start.  Blood pressure ranging 494-496 systolic.  Discussed low salt diet and food items to avoid.   Blood sugars doing well ranging 128-135.  Discussed diabetes management and gave patient zero sugar drink options instead of sugar option drinks.  No concerns.   ? ?Care Plan : RN Care Manager Plan of Care  ?Updates made by Jon Billings, RN since 08/06/2021 12:00 AM  ?  ? ?Problem: Chronic Disease Management and Care Coordination Needs Diabetes and HTN   ?Priority: High  ?  ? ?Long-Range Goal: Development of Plan of Care for Management of Diabetes and HTN   ?Start Date: 03/29/2021  ?Expected End Date: 04/26/2022  ?This Visit's Progress: On track  ?Recent Progress: On track  ?Priority: High  ?Note:   ?Current Barriers:  ?Chronic Disease Management support and education needs related to HTN and DMII  ? ?RNCM Clinical Goal(s):  ?Patient will verbalize basic understanding of  HTN and DMII disease process and self health management plan as evidenced by A1c less than 7 and blood pressure less than 140/80  through collaboration with RN Care manager, provider, and care team.  ? ?Interventions: ?Education and support related to diabetes and HTN management ?Inter-disciplinary care team collaboration (see longitudinal plan of care) ?Evaluation of current treatment plan related to  self management and patient's adherence to plan as established by provider ? ? ?Diabetes Interventions:  (Status:  Goal on track:  Yes.) Long Term Goal ?Assessed patient's understanding of A1c goal: <7% ?Provided education to patient about basic DM disease process ?Counseled on importance of regular laboratory monitoring as prescribed ?Discussed plans with patient for ongoing care management follow up and provided  patient with direct contact information for care management team ?Lab Results  ?Component Value Date  ? HGBA1C 14.3 (H) 04/24/2020  ?06/19/21 Patient reports sugars doing well. Last A1c 6.5.  Encouraged patient to keep up the great work.  Reiterated diabetes management.   ? ? ?Diabetes Management Discussed: ?Medication adherence ?Reviewed importance of limiting carbs such as rice, potatoes, breads, and pastas. Also discussed limiting sweets and sugary drinks.  Discussed importance of portion control.  Also discussed importance of annual exams, foot exams, and eye exams.   ? ?Patient Goals/Self-Care Activities: Diabetes ?Take all medications as prescribed ?Call provider office for new concerns or questions  ?check blood sugar at prescribed times: twice daily ?check feet daily for cuts, sores or redness ?take the blood sugar log to all doctor visits ?manage portion size ?switch to sugar-free drinks ?Exercise regularly ? ?Hypertension Interventions:  (Status:  New goal.) Long Term Goal ?Last practice recorded BP readings:  ?BP Readings from Last 3 Encounters:  ?01/08/21 (!) 162/81  ?04/26/20 (!) 148/85  ?04/23/20 (!) 149/73  ?Most recent eGFR/CrCl: No results found for: EGFR  No components found for: CRCL ? ?Evaluation of current treatment plan related to hypertension self management and patient's adherence to plan as established by provider ?Provided education to patient re: stroke prevention, s/s of heart attack and stroke ?Discussed plans with patient for ongoing care management follow up and provided patient with direct contact information for care management team ?Provided education on prescribed diet low salt, diabetic ? ?Patient Goals/Self-Care Activities: Hypertension ?check blood pressure daily ?write blood pressure results in a log or diary ?take  blood pressure log to all doctor appointments ?Limit salt intake ?Avoid sport drinks such as Gatorade ? ?08/06/21 Patient reports blood pressure trending up with  systolic ranging 964-383.  Discussed with patient low salt diet and low salt snack items.  ? ?Follow Up Plan:  Telephone follow up appointment with care management team member scheduled for:  June ?The patient has been provided with contact information for the care management team and has been advised to call with any health related questions or concerns.  ? ?  ? ?Plan: Follow-up: Patient agrees to Care Plan and Follow-up. ?Follow-up in June. ? ?Jone Baseman, RN, MSN ?[facility] Care Management ?Care Management Coordinator ?Direct Line [phone] ?Toll Free: [phone]  ?Fax: [phone] ? ?

## 2021-08-06 NOTE — Patient Instructions (Signed)
Patient Goals/Self-Care Activities: Diabetes ?Take all medications as prescribed ?Call provider office for new concerns or questions  ?check blood sugar at prescribed times: twice daily ?check feet daily for cuts, sores or redness ?take the blood sugar log to all doctor visits ?manage portion size ?switch to sugar-free drinks ?Exercise regularly ? ?Patient Goals/Self-Care Activities: Hypertension ?check blood pressure daily ?write blood pressure results in a log or diary ?take blood pressure log to all doctor appointments ?Limit salt intake ?Avoid sport drinks such as Gatorade ?

## 2021-08-30 DIAGNOSIS — I10 Essential (primary) hypertension: Secondary | ICD-10-CM | POA: Diagnosis not present

## 2021-08-30 DIAGNOSIS — Z0000 Encounter for general adult medical examination without abnormal findings: Secondary | ICD-10-CM | POA: Diagnosis not present

## 2021-08-30 DIAGNOSIS — E1121 Type 2 diabetes mellitus with diabetic nephropathy: Secondary | ICD-10-CM | POA: Diagnosis not present

## 2021-08-30 DIAGNOSIS — Z1159 Encounter for screening for other viral diseases: Secondary | ICD-10-CM | POA: Diagnosis not present

## 2021-08-30 DIAGNOSIS — K635 Polyp of colon: Secondary | ICD-10-CM | POA: Diagnosis not present

## 2021-08-30 DIAGNOSIS — E785 Hyperlipidemia, unspecified: Secondary | ICD-10-CM | POA: Diagnosis not present

## 2021-08-30 DIAGNOSIS — Z23 Encounter for immunization: Secondary | ICD-10-CM | POA: Diagnosis not present

## 2021-08-30 DIAGNOSIS — N400 Enlarged prostate without lower urinary tract symptoms: Secondary | ICD-10-CM | POA: Diagnosis not present

## 2021-10-04 ENCOUNTER — Other Ambulatory Visit: Payer: Self-pay

## 2021-10-04 NOTE — Patient Outreach (Signed)
[facility]) Care Management  10/04/2021  [place] [birth year] [id number]   Telephone call to patient for disease management follow up. Patient reports he is doing good.  He continues to make good food choices.  A1c 6.31 Aug 2021.  Blood pressure management continues.  No concerns.  Care Plan : RN Care Manager Plan of Care  Updates made by Jon Billings, RN since 10/04/2021 12:00 AM     Problem: Chronic Disease Management and Care Coordination Needs Diabetes and HTN   Priority: High     Long-Range Goal: Development of Plan of Care for Management of Diabetes and HTN   Start Date: 03/29/2021  Expected End Date: 04/26/2022  This Visit's Progress: On track  Recent Progress: On track  Priority: High  Note:   Current Barriers:  Chronic Disease Management support and education needs related to HTN and DMII   RNCM Clinical Goal(s):  Patient will verbalize basic understanding of  HTN and DMII disease process and self health management plan as evidenced by A1c less than 7 and blood pressure less than 140/80  through collaboration with RN Care manager, provider, and care team.   Interventions: Education and support related to diabetes and HTN management Inter-disciplinary care team collaboration (see longitudinal plan of care) Evaluation of current treatment plan related to  self management and patient's adherence to plan as established by provider   Diabetes Interventions:  (Status:  Goal on track:  Yes.) Long Term Goal Assessed patient's understanding of A1c goal: <7% Provided education to patient about basic DM disease process Counseled on importance of regular laboratory monitoring as prescribed Discussed plans with patient for ongoing care management follow up and provided patient with direct contact information for care management team Lab Results  Component Value Date   HGBA1C 14.3 (H) 04/24/2020  06/19/21 Patient reports sugars doing well. Last A1c 6.5.   Encouraged patient to keep up the great work.  Reiterated diabetes management.    10/04/21 Patient doing well.  Last A1c 6.6.  Blood sugar 111.  Discussed diet and encouraged to continue diabetes management.    Diabetes Management Discussed: Medication adherence Reviewed importance of limiting carbs such as rice, potatoes, breads, and pastas. Also discussed limiting sweets and sugary drinks.  Discussed importance of portion control.  Also discussed importance of annual exams, foot exams, and eye exams.    Patient Goals/Self-Care Activities: Diabetes Take all medications as prescribed Call provider office for new concerns or questions  check blood sugar at prescribed times: twice daily check feet daily for cuts, sores or redness take the blood sugar log to all doctor visits manage portion size switch to sugar-free drinks Exercise regularly  Hypertension Interventions:  (Status:  Goal on track:  Yes.) Long Term Goal Last practice recorded BP readings:  BP Readings from Last 3 Encounters:  01/08/21 (!) 162/81  04/26/20 (!) 148/85  04/23/20 (!) 149/73  Most recent eGFR/CrCl: No results found for: EGFR  No components found for: CRCL  Evaluation of current treatment plan related to hypertension self management and patient's adherence to plan as established by provider Provided education to patient re: stroke prevention, s/s of heart attack and stroke Discussed plans with patient for ongoing care management follow up and provided patient with direct contact information for care management team Provided education on prescribed diet low salt, diabetic  Patient Goals/Self-Care Activities: Hypertension check blood pressure daily write blood pressure results in a log or diary take blood pressure log to all doctor  appointments Limit salt intake Avoid sport drinks such as Gatorade  08/06/21 Patient reports blood pressure trending up with systolic ranging 030-092.  Discussed with patient low  salt diet and low salt snack items.   10/04/21 Patient reports blood pressure doing good.  Systolic Ranging 330-076.  Patient eating low salt items.    Follow Up Plan:  Telephone follow up appointment with care management team member scheduled for:  August The patient has been provided with contact information for the care management team and has been advised to call with any health related questions or concerns.      Plan: Follow-up: Patient agrees to Care Plan and Follow-up. Follow-up in August.    Dohn Stclair J Vidyuth Belsito, RN, MSN [place] [facility] Care Management Coordinator Direct Line [phone] Toll Free: [phone]  Fax: [phone]

## 2021-10-04 NOTE — Patient Instructions (Addendum)
Patient Goals/Self-Care Activities: Diabetes Take all medications as prescribed Call provider office for new concerns or questions  check blood sugar at prescribed times: twice daily check feet daily for cuts, sores or redness take the blood sugar log to all doctor visits manage portion size switch to sugar-free drinks Exercise regularly  Patient Goals/Self-Care Activities: Hypertension check blood pressure daily write blood pressure results in a log or diary take blood pressure log to all doctor appointments Limit salt intake Avoid sport drinks such as Gatorade

## 2021-10-04 NOTE — Patient Outreach (Signed)
[facility]) Care Management  10/04/2021  [place] [birth year] [id number]   Telephone call to patient for follow up. Patient reports he is busy. Advised that CM would call another time. Patient agreeable.   Plan: RN CM will attempt again next week.  Jone Baseman, RN, MSN [facility] Care Management Care Management Coordinator Direct Line [phone] Toll Free: [phone]  Fax: [phone]

## 2021-10-08 ENCOUNTER — Ambulatory Visit: Payer: Self-pay

## 2021-10-21 DIAGNOSIS — K648 Other hemorrhoids: Secondary | ICD-10-CM | POA: Diagnosis not present

## 2021-10-21 DIAGNOSIS — D573 Sickle-cell trait: Secondary | ICD-10-CM | POA: Diagnosis not present

## 2021-10-21 DIAGNOSIS — Z86010 Personal history of colonic polyps: Secondary | ICD-10-CM | POA: Diagnosis not present

## 2021-10-25 DIAGNOSIS — E119 Type 2 diabetes mellitus without complications: Secondary | ICD-10-CM | POA: Diagnosis not present

## 2021-10-25 DIAGNOSIS — H25813 Combined forms of age-related cataract, bilateral: Secondary | ICD-10-CM | POA: Diagnosis not present

## 2021-10-25 DIAGNOSIS — H353131 Nonexudative age-related macular degeneration, bilateral, early dry stage: Secondary | ICD-10-CM | POA: Diagnosis not present

## 2021-10-25 DIAGNOSIS — H02831 Dermatochalasis of right upper eyelid: Secondary | ICD-10-CM | POA: Diagnosis not present

## 2021-10-31 DIAGNOSIS — Z86010 Personal history of colonic polyps: Secondary | ICD-10-CM | POA: Diagnosis not present

## 2021-10-31 DIAGNOSIS — Z09 Encounter for follow-up examination after completed treatment for conditions other than malignant neoplasm: Secondary | ICD-10-CM | POA: Diagnosis not present

## 2021-10-31 DIAGNOSIS — D122 Benign neoplasm of ascending colon: Secondary | ICD-10-CM | POA: Diagnosis not present

## 2021-10-31 DIAGNOSIS — D123 Benign neoplasm of transverse colon: Secondary | ICD-10-CM | POA: Diagnosis not present

## 2021-11-04 DIAGNOSIS — D123 Benign neoplasm of transverse colon: Secondary | ICD-10-CM | POA: Diagnosis not present

## 2021-11-04 DIAGNOSIS — D122 Benign neoplasm of ascending colon: Secondary | ICD-10-CM | POA: Diagnosis not present

## 2021-11-08 ENCOUNTER — Other Ambulatory Visit: Payer: Self-pay

## 2021-11-08 NOTE — Patient Outreach (Signed)
[facility]) Care Management  11/08/2021  [place] July 19, 1950 [id number]   Follow up with patient after receiving call from PCP office about patient assistance.  Spoke with patient he states he does not need patient assistance as he is not taking the jardiance,  Blood sugars 120-130 range.  Discussed diabetes management and importance.    Care Plan : RN Care Manager Plan of Care  Updates made by Jon Billings, RN since 11/08/2021 12:00 AM     Problem: Chronic Disease Management and Care Coordination Needs Diabetes and HTN   Priority: High     Long-Range Goal: Development of Plan of Care for Management of Diabetes and HTN   Start Date: 03/29/2021  Expected End Date: 04/26/2022  This Visit's Progress: On track  Recent Progress: On track  Priority: High  Note:   Current Barriers:  Chronic Disease Management support and education needs related to HTN and DMII   RNCM Clinical Goal(s):  Patient will verbalize basic understanding of  HTN and DMII disease process and self health management plan as evidenced by A1c less than 7 and blood pressure less than 140/80  through collaboration with RN Care manager, provider, and care team.   Interventions: Education and support related to diabetes and HTN management Inter-disciplinary care team collaboration (see longitudinal plan of care) Evaluation of current treatment plan related to  self management and patient's adherence to plan as established by provider   Diabetes Interventions:  (Status:  Goal on track:  Yes.) Long Term Goal Assessed patient's understanding of A1c goal: <7% Provided education to patient about basic DM disease process Counseled on importance of regular laboratory monitoring as prescribed Discussed plans with patient for ongoing care management follow up and provided patient with direct contact information for care management team Lab Results  Component Value Date   HGBA1C 14.3 (H) 04/24/2020   06/19/21 Patient reports sugars doing well. Last A1c 6.5.  Encouraged patient to keep up the great work.  Reiterated diabetes management.    10/04/21 Patient doing well.  Last A1c 6.6.  Blood sugar 111.  Discussed diet and encouraged to continue diabetes management.   11/08/21 Patient reports doing good. Discussed medication as CM received call from MD office about patient having problems affording medication.   Patient states he is not taking jardiance due to cost.  He states that he does not plan to take it. He is taking his novolog insulin.  He declined pharmacy referral.  Discussed diabetes management and importance.    Diabetes Management Discussed: Medication adherence Reviewed importance of limiting carbs such as rice, potatoes, breads, and pastas. Also discussed limiting sweets and sugary drinks.  Discussed importance of portion control.  Also discussed importance of annual exams, foot exams, and eye exams.    Patient Goals/Self-Care Activities: Diabetes Take all medications as prescribed Call provider office for new concerns or questions  check blood sugar at prescribed times: twice daily check feet daily for cuts, sores or redness take the blood sugar log to all doctor visits manage portion size switch to sugar-free drinks Exercise regularly  Hypertension Interventions:  (Status:  Goal on track:  Yes.) Long Term Goal Last practice recorded BP readings:  BP Readings from Last 3 Encounters:  01/08/21 (!) 162/81  04/26/20 (!) 148/85  04/23/20 (!) 149/73  Most recent eGFR/CrCl: No results found for: EGFR  No components found for: CRCL  Evaluation of current treatment plan related to hypertension self management and patient's adherence to plan  as established by provider Provided education to patient re: stroke prevention, s/s of heart attack and stroke Discussed plans with patient for ongoing care management follow up and provided patient with direct contact information for care  management team Provided education on prescribed diet low salt, diabetic  Patient Goals/Self-Care Activities: Hypertension check blood pressure daily write blood pressure results in a log or diary take blood pressure log to all doctor appointments Limit salt intake Avoid sport drinks such as Gatorade  08/06/21 Patient reports blood pressure trending up with systolic ranging 992-780.  Discussed with patient low salt diet and low salt snack items.   10/04/21 Patient reports blood pressure doing good.  Systolic Ranging 044-715.  Patient eating low salt items.    11/08/21 Patient no blood pressure problems.  Systolic-132.  No concerns.    Follow Up Plan:  Telephone follow up appointment with care management team member scheduled for:  August The patient has been provided with contact information for the care management team and has been advised to call with any health related questions or concerns.      Plan: Follow-up: Patient agrees to Care Plan and Follow-up. Follow-up in August.  Haizlee Henton J Joclyn Alsobrook, RN, MSN [place] [facility] Care Management Coordinator Direct Line [phone] Toll Free: [phone]  Fax: [phone]

## 2021-11-29 ENCOUNTER — Other Ambulatory Visit: Payer: Self-pay

## 2021-11-29 NOTE — Patient Outreach (Signed)
[facility]) Care Management  11/29/2021  [place] October 21, 1950 [id number]   Telephone call to patient for case closure. Patient doing ok and agreeable to case closure. No concerns.    Care Plan : RN Care Manager Plan of Care  Updates made by Jon Billings, RN since 11/29/2021 12:00 AM  Completed 11/29/2021   Problem: Chronic Disease Management and Care Coordination Needs Diabetes and HTN Resolved 11/29/2021  Priority: High     Long-Range Goal: Development of Plan of Care for Management of Diabetes and HTN Completed 11/29/2021  Start Date: 03/29/2021  Expected End Date: 04/26/2022  This Visit's Progress: On track  Recent Progress: On track  Priority: High  Note:   Current Barriers:  Chronic Disease Management support and education needs related to HTN and DMII   RNCM Clinical Goal(s):  Patient will verbalize basic understanding of  HTN and DMII disease process and self health management plan as evidenced by A1c less than 7 and blood pressure less than 140/80  through collaboration with RN Care manager, provider, and care team.   Interventions: Education and support related to diabetes and HTN management Inter-disciplinary care team collaboration (see longitudinal plan of care) Evaluation of current treatment plan related to  self management and patient's adherence to plan as established by provider   Diabetes Interventions:  (Status:  Goal on track:  Yes.) Long Term Goal Assessed patient's understanding of A1c goal: <7% Provided education to patient about basic DM disease process Counseled on importance of regular laboratory monitoring as prescribed Discussed plans with patient for ongoing care management follow up and provided patient with direct contact information for care management team Lab Results  Component Value Date   HGBA1C 14.3 (H) 04/24/2020  06/19/21 Patient reports sugars doing well. Last A1c 6.5.  Encouraged patient to keep up the great work.   Reiterated diabetes management.    10/04/21 Patient doing well.  Last A1c 6.6.  Blood sugar 111.  Discussed diet and encouraged to continue diabetes management.   11/08/21 Patient reports doing good. Discussed medication as CM received call from MD office about patient having problems affording medication.   Patient states he is not taking jardiance due to cost.  He states that he does not plan to take it. He is taking his novolog insulin.  He declined pharmacy referral.  Discussed diabetes management and importance.   11/29/21 Patient doing good sugars around 130's.   Diabetes Management Discussed: Medication adherence Reviewed importance of limiting carbs such as rice, potatoes, breads, and pastas. Also discussed limiting sweets and sugary drinks.  Discussed importance of portion control.  Also discussed importance of annual exams, foot exams, and eye exams.    Patient Goals/Self-Care Activities: Diabetes Take all medications as prescribed Call provider office for new concerns or questions  check blood sugar at prescribed times: twice daily check feet daily for cuts, sores or redness take the blood sugar log to all doctor visits manage portion size switch to sugar-free drinks Exercise regularly  Hypertension Interventions:  (Status:  Goal on track:  Yes.) Long Term Goal Last practice recorded BP readings:  BP Readings from Last 3 Encounters:  01/08/21 (!) 162/81  04/26/20 (!) 148/85  04/23/20 (!) 149/73  Most recent eGFR/CrCl: No results found for: EGFR  No components found for: CRCL  Evaluation of current treatment plan related to hypertension self management and patient's adherence to plan as established by provider Provided education to patient re: stroke prevention, s/s of heart attack  and stroke Discussed plans with patient for ongoing care management follow up and provided patient with direct contact information for care management team Provided education on prescribed diet low  salt, diabetic  Patient Goals/Self-Care Activities: Hypertension check blood pressure daily write blood pressure results in a log or diary take blood pressure log to all doctor appointments Limit salt intake Avoid sport drinks such as Gatorade  08/06/21 Patient reports blood pressure trending up with systolic ranging 179-810.  Discussed with patient low salt diet and low salt snack items.   10/04/21 Patient reports blood pressure doing good.  Systolic Ranging 254-862.  Patient eating low salt items.    11/08/21 Patient no blood pressure problems.  Systolic-132.  No concerns.  11/29/21   No blood pressure problems.   Follow Up Plan:  Closing case.    Plan: Closing case.   Jone Baseman, RN, MSN [facility] Care Management Care Management Coordinator Direct Line [phone] Toll Free: [phone]  Fax: [phone]

## 2022-01-06 DIAGNOSIS — E1121 Type 2 diabetes mellitus with diabetic nephropathy: Secondary | ICD-10-CM | POA: Diagnosis not present

## 2022-01-06 DIAGNOSIS — I10 Essential (primary) hypertension: Secondary | ICD-10-CM | POA: Diagnosis not present

## 2022-01-06 DIAGNOSIS — Z23 Encounter for immunization: Secondary | ICD-10-CM | POA: Diagnosis not present

## 2022-01-06 DIAGNOSIS — R079 Chest pain, unspecified: Secondary | ICD-10-CM | POA: Diagnosis not present

## 2022-01-06 DIAGNOSIS — E785 Hyperlipidemia, unspecified: Secondary | ICD-10-CM | POA: Diagnosis not present

## 2022-01-09 DIAGNOSIS — E785 Hyperlipidemia, unspecified: Secondary | ICD-10-CM | POA: Diagnosis not present

## 2022-01-09 DIAGNOSIS — I10 Essential (primary) hypertension: Secondary | ICD-10-CM | POA: Diagnosis not present

## 2022-01-09 DIAGNOSIS — N400 Enlarged prostate without lower urinary tract symptoms: Secondary | ICD-10-CM | POA: Diagnosis not present

## 2022-01-09 DIAGNOSIS — E1121 Type 2 diabetes mellitus with diabetic nephropathy: Secondary | ICD-10-CM | POA: Diagnosis not present

## 2022-03-03 DIAGNOSIS — E785 Hyperlipidemia, unspecified: Secondary | ICD-10-CM | POA: Diagnosis not present

## 2022-03-03 DIAGNOSIS — E1121 Type 2 diabetes mellitus with diabetic nephropathy: Secondary | ICD-10-CM | POA: Diagnosis not present

## 2022-03-03 DIAGNOSIS — R600 Localized edema: Secondary | ICD-10-CM | POA: Diagnosis not present

## 2022-03-03 DIAGNOSIS — I10 Essential (primary) hypertension: Secondary | ICD-10-CM | POA: Diagnosis not present

## 2022-04-02 DIAGNOSIS — E1121 Type 2 diabetes mellitus with diabetic nephropathy: Secondary | ICD-10-CM | POA: Diagnosis not present

## 2022-05-15 DIAGNOSIS — E785 Hyperlipidemia, unspecified: Secondary | ICD-10-CM | POA: Diagnosis not present

## 2022-05-15 DIAGNOSIS — E1165 Type 2 diabetes mellitus with hyperglycemia: Secondary | ICD-10-CM | POA: Diagnosis not present

## 2022-05-15 DIAGNOSIS — E1121 Type 2 diabetes mellitus with diabetic nephropathy: Secondary | ICD-10-CM | POA: Diagnosis not present

## 2022-05-15 DIAGNOSIS — I10 Essential (primary) hypertension: Secondary | ICD-10-CM | POA: Diagnosis not present

## 2022-05-15 DIAGNOSIS — E1136 Type 2 diabetes mellitus with diabetic cataract: Secondary | ICD-10-CM | POA: Diagnosis not present

## 2022-05-15 DIAGNOSIS — N400 Enlarged prostate without lower urinary tract symptoms: Secondary | ICD-10-CM | POA: Diagnosis not present

## 2022-07-02 DIAGNOSIS — I10 Essential (primary) hypertension: Secondary | ICD-10-CM | POA: Diagnosis not present

## 2022-07-02 DIAGNOSIS — Z6841 Body Mass Index (BMI) 40.0 and over, adult: Secondary | ICD-10-CM | POA: Diagnosis not present

## 2022-07-02 DIAGNOSIS — E1136 Type 2 diabetes mellitus with diabetic cataract: Secondary | ICD-10-CM | POA: Diagnosis not present

## 2022-07-02 DIAGNOSIS — E785 Hyperlipidemia, unspecified: Secondary | ICD-10-CM | POA: Diagnosis not present

## 2022-07-02 DIAGNOSIS — E1165 Type 2 diabetes mellitus with hyperglycemia: Secondary | ICD-10-CM | POA: Diagnosis not present

## 2022-07-02 DIAGNOSIS — E1121 Type 2 diabetes mellitus with diabetic nephropathy: Secondary | ICD-10-CM | POA: Diagnosis not present

## 2022-07-11 DIAGNOSIS — E1121 Type 2 diabetes mellitus with diabetic nephropathy: Secondary | ICD-10-CM | POA: Diagnosis not present

## 2022-07-11 DIAGNOSIS — I10 Essential (primary) hypertension: Secondary | ICD-10-CM | POA: Diagnosis not present

## 2022-07-11 DIAGNOSIS — E785 Hyperlipidemia, unspecified: Secondary | ICD-10-CM | POA: Diagnosis not present

## 2022-08-31 IMAGING — DX DG CHEST 1V PORT
1 series · 2 of 2 positions shown · non-contrast
Comparison: None.

CLINICAL DATA: Sore throat

EXAM:
PORTABLE CHEST 1 VIEW

[Series 1: chest ap · 0.14mm/px · 2 of 2 slices shown]
[im 1/2]
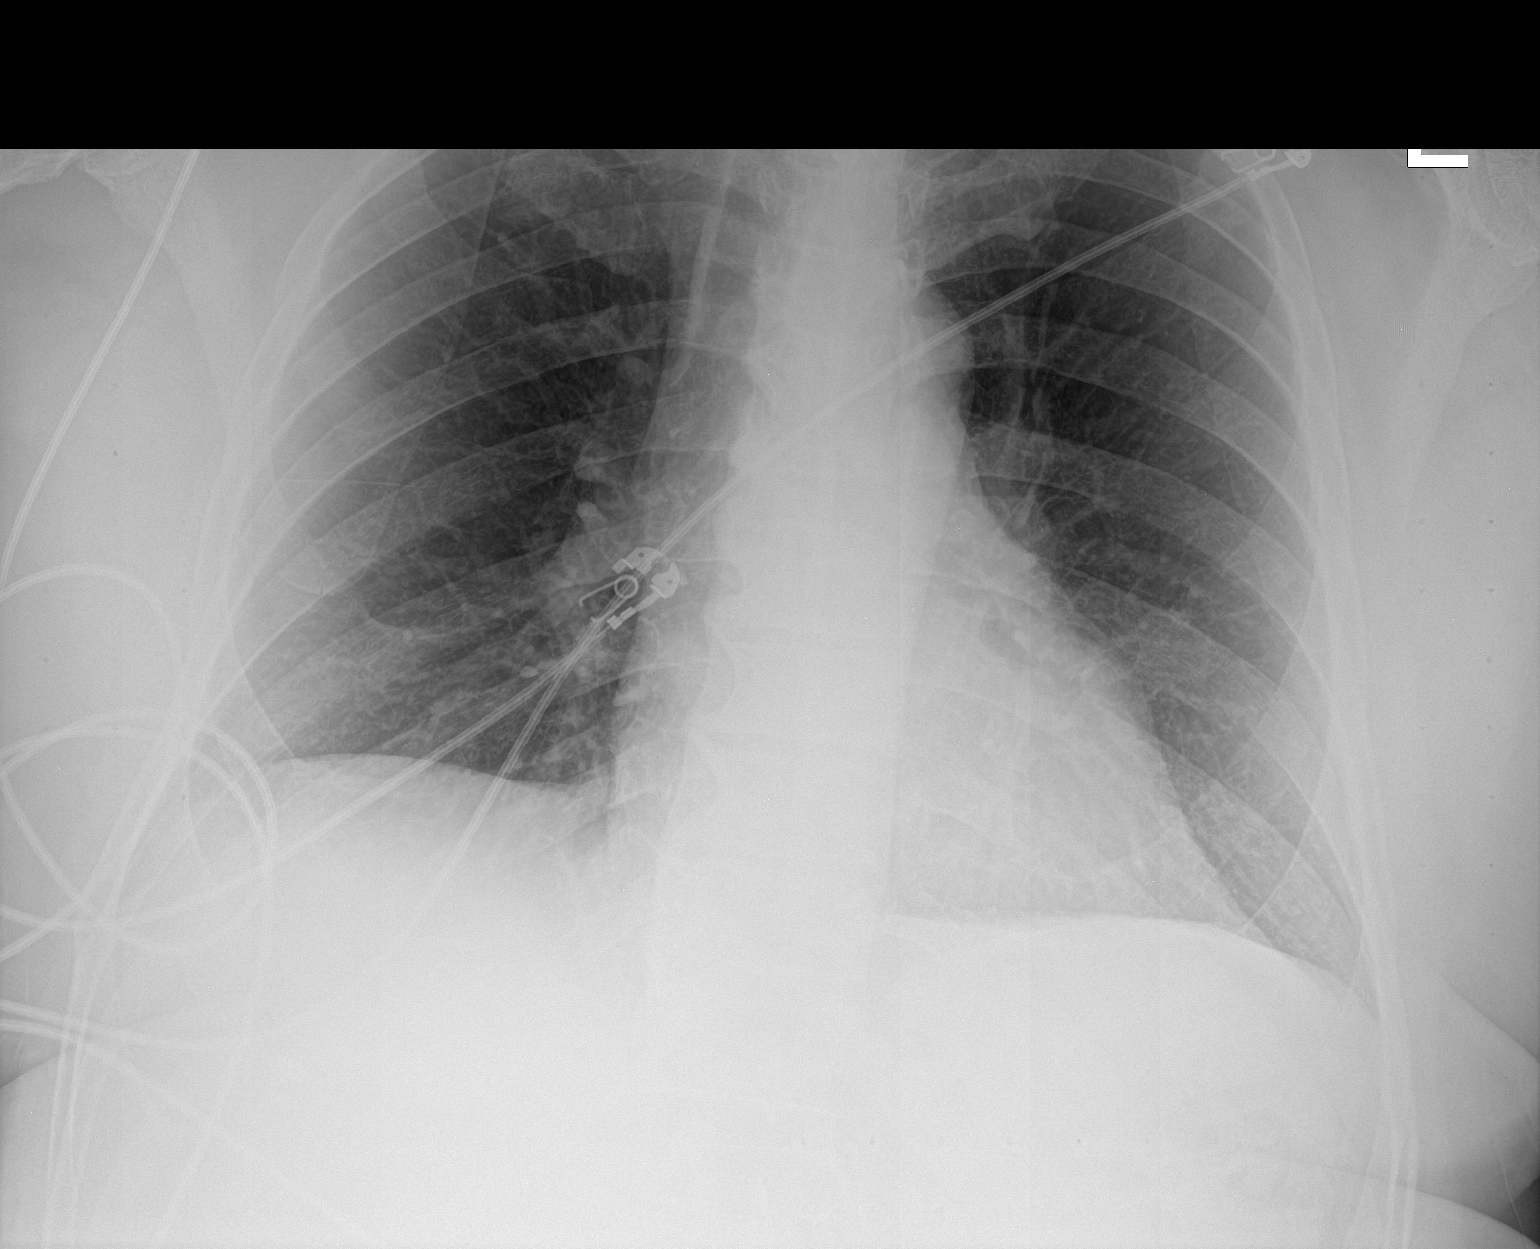
[im 2/2]
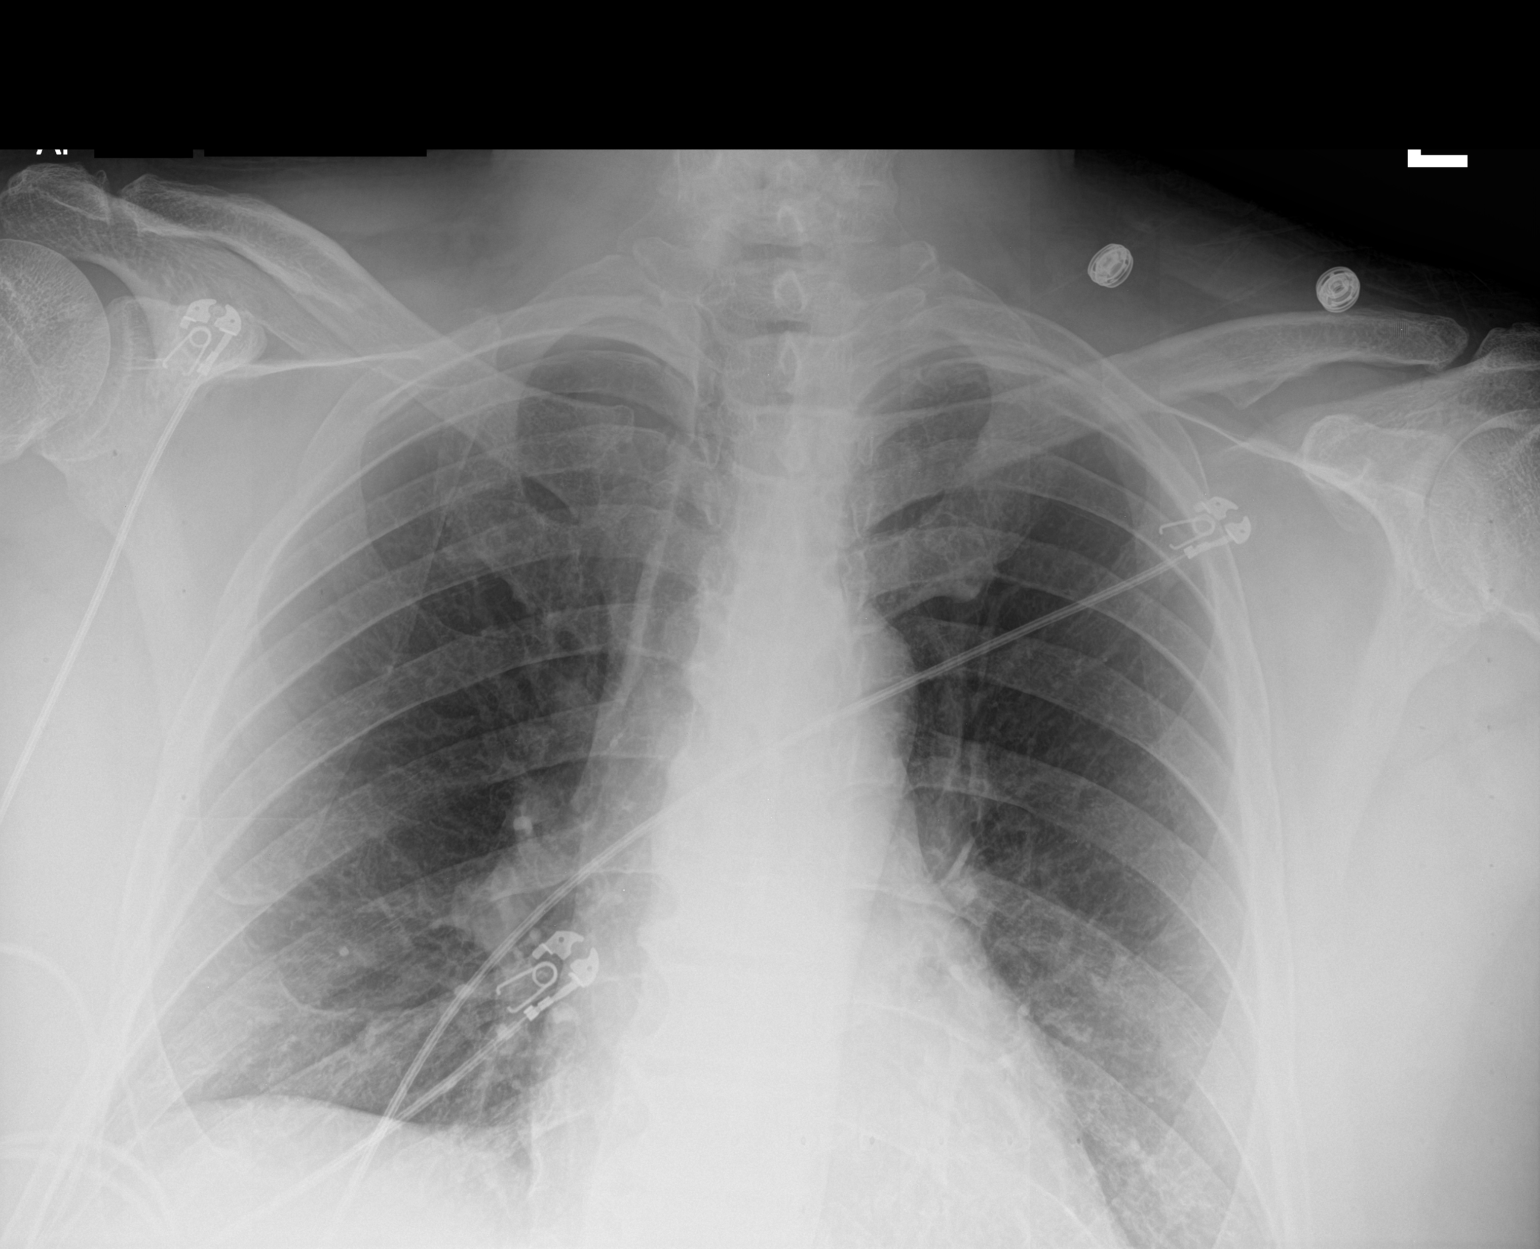

[2 of 2 positions shown; findings below may reference images not displayed]

FINDINGS: The heart size and mediastinal contours are within normal limits.
Mildly hazy airspace opacity seen at the right lung base. The
visualized skeletal structures are unremarkable.
IMPRESSION: Mildly hazy airspace opacity at the right lung base which may be due
to atelectasis and/or infectious etiology.

## 2022-10-02 DIAGNOSIS — Z125 Encounter for screening for malignant neoplasm of prostate: Secondary | ICD-10-CM | POA: Diagnosis not present

## 2022-10-02 DIAGNOSIS — D573 Sickle-cell trait: Secondary | ICD-10-CM | POA: Diagnosis not present

## 2022-10-02 DIAGNOSIS — E1121 Type 2 diabetes mellitus with diabetic nephropathy: Secondary | ICD-10-CM | POA: Diagnosis not present

## 2022-10-02 DIAGNOSIS — E785 Hyperlipidemia, unspecified: Secondary | ICD-10-CM | POA: Diagnosis not present

## 2022-10-02 DIAGNOSIS — N400 Enlarged prostate without lower urinary tract symptoms: Secondary | ICD-10-CM | POA: Diagnosis not present

## 2022-10-02 DIAGNOSIS — R609 Edema, unspecified: Secondary | ICD-10-CM | POA: Diagnosis not present

## 2022-10-02 DIAGNOSIS — Z0000 Encounter for general adult medical examination without abnormal findings: Secondary | ICD-10-CM | POA: Diagnosis not present

## 2022-10-02 DIAGNOSIS — I10 Essential (primary) hypertension: Secondary | ICD-10-CM | POA: Diagnosis not present

## 2022-10-21 DIAGNOSIS — D649 Anemia, unspecified: Secondary | ICD-10-CM | POA: Diagnosis not present

## 2022-12-04 DIAGNOSIS — H353131 Nonexudative age-related macular degeneration, bilateral, early dry stage: Secondary | ICD-10-CM | POA: Diagnosis not present

## 2022-12-04 DIAGNOSIS — E119 Type 2 diabetes mellitus without complications: Secondary | ICD-10-CM | POA: Diagnosis not present

## 2022-12-04 DIAGNOSIS — H25813 Combined forms of age-related cataract, bilateral: Secondary | ICD-10-CM | POA: Diagnosis not present

## 2022-12-04 DIAGNOSIS — H18511 Endothelial corneal dystrophy, right eye: Secondary | ICD-10-CM | POA: Diagnosis not present

## 2023-04-01 DIAGNOSIS — E1136 Type 2 diabetes mellitus with diabetic cataract: Secondary | ICD-10-CM | POA: Diagnosis not present

## 2023-04-01 DIAGNOSIS — E1121 Type 2 diabetes mellitus with diabetic nephropathy: Secondary | ICD-10-CM | POA: Diagnosis not present

## 2023-04-01 DIAGNOSIS — D573 Sickle-cell trait: Secondary | ICD-10-CM | POA: Diagnosis not present

## 2023-04-01 DIAGNOSIS — E1165 Type 2 diabetes mellitus with hyperglycemia: Secondary | ICD-10-CM | POA: Diagnosis not present

## 2023-04-01 DIAGNOSIS — I10 Essential (primary) hypertension: Secondary | ICD-10-CM | POA: Diagnosis not present

## 2023-04-01 DIAGNOSIS — E785 Hyperlipidemia, unspecified: Secondary | ICD-10-CM | POA: Diagnosis not present

## 2023-04-01 DIAGNOSIS — Z6841 Body Mass Index (BMI) 40.0 and over, adult: Secondary | ICD-10-CM | POA: Diagnosis not present

## 2023-04-01 DIAGNOSIS — N400 Enlarged prostate without lower urinary tract symptoms: Secondary | ICD-10-CM | POA: Diagnosis not present

## 2023-04-01 DIAGNOSIS — D696 Thrombocytopenia, unspecified: Secondary | ICD-10-CM | POA: Diagnosis not present

## 2023-04-02 DIAGNOSIS — E1121 Type 2 diabetes mellitus with diabetic nephropathy: Secondary | ICD-10-CM | POA: Diagnosis not present

## 2023-09-23 DIAGNOSIS — E1165 Type 2 diabetes mellitus with hyperglycemia: Secondary | ICD-10-CM | POA: Diagnosis not present

## 2023-09-23 DIAGNOSIS — I10 Essential (primary) hypertension: Secondary | ICD-10-CM | POA: Diagnosis not present

## 2023-09-23 DIAGNOSIS — E1121 Type 2 diabetes mellitus with diabetic nephropathy: Secondary | ICD-10-CM | POA: Diagnosis not present

## 2023-09-23 DIAGNOSIS — E1136 Type 2 diabetes mellitus with diabetic cataract: Secondary | ICD-10-CM | POA: Diagnosis not present

## 2023-09-26 DIAGNOSIS — E785 Hyperlipidemia, unspecified: Secondary | ICD-10-CM | POA: Diagnosis not present

## 2023-09-26 DIAGNOSIS — N400 Enlarged prostate without lower urinary tract symptoms: Secondary | ICD-10-CM | POA: Diagnosis not present

## 2023-09-26 DIAGNOSIS — E1121 Type 2 diabetes mellitus with diabetic nephropathy: Secondary | ICD-10-CM | POA: Diagnosis not present

## 2023-09-26 DIAGNOSIS — E1136 Type 2 diabetes mellitus with diabetic cataract: Secondary | ICD-10-CM | POA: Diagnosis not present

## 2023-09-26 DIAGNOSIS — I10 Essential (primary) hypertension: Secondary | ICD-10-CM | POA: Diagnosis not present

## 2023-09-26 DIAGNOSIS — E1165 Type 2 diabetes mellitus with hyperglycemia: Secondary | ICD-10-CM | POA: Diagnosis not present

## 2023-10-02 DIAGNOSIS — E1121 Type 2 diabetes mellitus with diabetic nephropathy: Secondary | ICD-10-CM | POA: Diagnosis not present

## 2023-10-02 DIAGNOSIS — Z125 Encounter for screening for malignant neoplasm of prostate: Secondary | ICD-10-CM | POA: Diagnosis not present

## 2023-10-02 DIAGNOSIS — I10 Essential (primary) hypertension: Secondary | ICD-10-CM | POA: Diagnosis not present

## 2023-10-02 DIAGNOSIS — E785 Hyperlipidemia, unspecified: Secondary | ICD-10-CM | POA: Diagnosis not present

## 2023-10-06 DIAGNOSIS — E1121 Type 2 diabetes mellitus with diabetic nephropathy: Secondary | ICD-10-CM | POA: Diagnosis not present

## 2023-10-06 DIAGNOSIS — Z0189 Encounter for other specified special examinations: Secondary | ICD-10-CM | POA: Diagnosis not present

## 2023-10-06 DIAGNOSIS — Z125 Encounter for screening for malignant neoplasm of prostate: Secondary | ICD-10-CM | POA: Diagnosis not present

## 2023-10-06 DIAGNOSIS — E785 Hyperlipidemia, unspecified: Secondary | ICD-10-CM | POA: Diagnosis not present

## 2023-10-06 DIAGNOSIS — D696 Thrombocytopenia, unspecified: Secondary | ICD-10-CM | POA: Diagnosis not present

## 2023-10-06 DIAGNOSIS — Z0000 Encounter for general adult medical examination without abnormal findings: Secondary | ICD-10-CM | POA: Diagnosis not present

## 2023-10-06 DIAGNOSIS — I10 Essential (primary) hypertension: Secondary | ICD-10-CM | POA: Diagnosis not present

## 2023-10-22 DIAGNOSIS — E1121 Type 2 diabetes mellitus with diabetic nephropathy: Secondary | ICD-10-CM | POA: Diagnosis not present

## 2023-10-22 DIAGNOSIS — E1136 Type 2 diabetes mellitus with diabetic cataract: Secondary | ICD-10-CM | POA: Diagnosis not present

## 2023-10-22 DIAGNOSIS — I10 Essential (primary) hypertension: Secondary | ICD-10-CM | POA: Diagnosis not present

## 2023-10-22 DIAGNOSIS — E1165 Type 2 diabetes mellitus with hyperglycemia: Secondary | ICD-10-CM | POA: Diagnosis not present

## 2023-10-26 DIAGNOSIS — E785 Hyperlipidemia, unspecified: Secondary | ICD-10-CM | POA: Diagnosis not present

## 2023-10-26 DIAGNOSIS — I10 Essential (primary) hypertension: Secondary | ICD-10-CM | POA: Diagnosis not present

## 2023-10-26 DIAGNOSIS — E1121 Type 2 diabetes mellitus with diabetic nephropathy: Secondary | ICD-10-CM | POA: Diagnosis not present

## 2023-10-26 DIAGNOSIS — E1136 Type 2 diabetes mellitus with diabetic cataract: Secondary | ICD-10-CM | POA: Diagnosis not present

## 2023-10-26 DIAGNOSIS — N400 Enlarged prostate without lower urinary tract symptoms: Secondary | ICD-10-CM | POA: Diagnosis not present

## 2023-10-26 DIAGNOSIS — E1165 Type 2 diabetes mellitus with hyperglycemia: Secondary | ICD-10-CM | POA: Diagnosis not present

## 2023-11-21 DIAGNOSIS — E1136 Type 2 diabetes mellitus with diabetic cataract: Secondary | ICD-10-CM | POA: Diagnosis not present

## 2023-11-21 DIAGNOSIS — E1165 Type 2 diabetes mellitus with hyperglycemia: Secondary | ICD-10-CM | POA: Diagnosis not present

## 2023-11-21 DIAGNOSIS — E1121 Type 2 diabetes mellitus with diabetic nephropathy: Secondary | ICD-10-CM | POA: Diagnosis not present

## 2023-11-21 DIAGNOSIS — I10 Essential (primary) hypertension: Secondary | ICD-10-CM | POA: Diagnosis not present

## 2023-11-26 DIAGNOSIS — E1136 Type 2 diabetes mellitus with diabetic cataract: Secondary | ICD-10-CM | POA: Diagnosis not present

## 2023-11-26 DIAGNOSIS — E1121 Type 2 diabetes mellitus with diabetic nephropathy: Secondary | ICD-10-CM | POA: Diagnosis not present

## 2023-11-26 DIAGNOSIS — I10 Essential (primary) hypertension: Secondary | ICD-10-CM | POA: Diagnosis not present

## 2023-11-26 DIAGNOSIS — E785 Hyperlipidemia, unspecified: Secondary | ICD-10-CM | POA: Diagnosis not present

## 2023-11-26 DIAGNOSIS — N400 Enlarged prostate without lower urinary tract symptoms: Secondary | ICD-10-CM | POA: Diagnosis not present

## 2023-11-26 DIAGNOSIS — E1165 Type 2 diabetes mellitus with hyperglycemia: Secondary | ICD-10-CM | POA: Diagnosis not present

## 2023-12-07 DIAGNOSIS — H353131 Nonexudative age-related macular degeneration, bilateral, early dry stage: Secondary | ICD-10-CM | POA: Diagnosis not present

## 2023-12-07 DIAGNOSIS — H25813 Combined forms of age-related cataract, bilateral: Secondary | ICD-10-CM | POA: Diagnosis not present

## 2023-12-07 DIAGNOSIS — E119 Type 2 diabetes mellitus without complications: Secondary | ICD-10-CM | POA: Diagnosis not present

## 2023-12-07 DIAGNOSIS — H524 Presbyopia: Secondary | ICD-10-CM | POA: Diagnosis not present

## 2023-12-07 DIAGNOSIS — D573 Sickle-cell trait: Secondary | ICD-10-CM | POA: Diagnosis not present

## 2023-12-21 DIAGNOSIS — E1121 Type 2 diabetes mellitus with diabetic nephropathy: Secondary | ICD-10-CM | POA: Diagnosis not present

## 2023-12-21 DIAGNOSIS — E1136 Type 2 diabetes mellitus with diabetic cataract: Secondary | ICD-10-CM | POA: Diagnosis not present

## 2023-12-21 DIAGNOSIS — I10 Essential (primary) hypertension: Secondary | ICD-10-CM | POA: Diagnosis not present

## 2023-12-21 DIAGNOSIS — E1165 Type 2 diabetes mellitus with hyperglycemia: Secondary | ICD-10-CM | POA: Diagnosis not present

## 2023-12-27 DIAGNOSIS — I10 Essential (primary) hypertension: Secondary | ICD-10-CM | POA: Diagnosis not present

## 2023-12-27 DIAGNOSIS — E1121 Type 2 diabetes mellitus with diabetic nephropathy: Secondary | ICD-10-CM | POA: Diagnosis not present

## 2023-12-27 DIAGNOSIS — E1165 Type 2 diabetes mellitus with hyperglycemia: Secondary | ICD-10-CM | POA: Diagnosis not present

## 2023-12-27 DIAGNOSIS — E1136 Type 2 diabetes mellitus with diabetic cataract: Secondary | ICD-10-CM | POA: Diagnosis not present

## 2023-12-27 DIAGNOSIS — N400 Enlarged prostate without lower urinary tract symptoms: Secondary | ICD-10-CM | POA: Diagnosis not present

## 2023-12-27 DIAGNOSIS — E785 Hyperlipidemia, unspecified: Secondary | ICD-10-CM | POA: Diagnosis not present

## 2024-01-06 DIAGNOSIS — E1121 Type 2 diabetes mellitus with diabetic nephropathy: Secondary | ICD-10-CM | POA: Diagnosis not present

## 2024-01-20 DIAGNOSIS — I10 Essential (primary) hypertension: Secondary | ICD-10-CM | POA: Diagnosis not present

## 2024-01-20 DIAGNOSIS — E1136 Type 2 diabetes mellitus with diabetic cataract: Secondary | ICD-10-CM | POA: Diagnosis not present

## 2024-01-20 DIAGNOSIS — E1165 Type 2 diabetes mellitus with hyperglycemia: Secondary | ICD-10-CM | POA: Diagnosis not present

## 2024-01-20 DIAGNOSIS — E1121 Type 2 diabetes mellitus with diabetic nephropathy: Secondary | ICD-10-CM | POA: Diagnosis not present

## 2024-01-26 DIAGNOSIS — E785 Hyperlipidemia, unspecified: Secondary | ICD-10-CM | POA: Diagnosis not present

## 2024-01-26 DIAGNOSIS — I10 Essential (primary) hypertension: Secondary | ICD-10-CM | POA: Diagnosis not present

## 2024-01-26 DIAGNOSIS — N400 Enlarged prostate without lower urinary tract symptoms: Secondary | ICD-10-CM | POA: Diagnosis not present

## 2024-01-26 DIAGNOSIS — E1165 Type 2 diabetes mellitus with hyperglycemia: Secondary | ICD-10-CM | POA: Diagnosis not present

## 2024-01-26 DIAGNOSIS — E1136 Type 2 diabetes mellitus with diabetic cataract: Secondary | ICD-10-CM | POA: Diagnosis not present

## 2024-01-26 DIAGNOSIS — E1121 Type 2 diabetes mellitus with diabetic nephropathy: Secondary | ICD-10-CM | POA: Diagnosis not present

## 2024-02-19 DIAGNOSIS — E1165 Type 2 diabetes mellitus with hyperglycemia: Secondary | ICD-10-CM | POA: Diagnosis not present

## 2024-02-19 DIAGNOSIS — E1136 Type 2 diabetes mellitus with diabetic cataract: Secondary | ICD-10-CM | POA: Diagnosis not present

## 2024-02-19 DIAGNOSIS — I10 Essential (primary) hypertension: Secondary | ICD-10-CM | POA: Diagnosis not present

## 2024-02-19 DIAGNOSIS — E1121 Type 2 diabetes mellitus with diabetic nephropathy: Secondary | ICD-10-CM | POA: Diagnosis not present

## 2024-02-26 DIAGNOSIS — E1165 Type 2 diabetes mellitus with hyperglycemia: Secondary | ICD-10-CM | POA: Diagnosis not present

## 2024-02-26 DIAGNOSIS — I10 Essential (primary) hypertension: Secondary | ICD-10-CM | POA: Diagnosis not present

## 2024-02-26 DIAGNOSIS — E1136 Type 2 diabetes mellitus with diabetic cataract: Secondary | ICD-10-CM | POA: Diagnosis not present

## 2024-02-26 DIAGNOSIS — E785 Hyperlipidemia, unspecified: Secondary | ICD-10-CM | POA: Diagnosis not present

## 2024-02-26 DIAGNOSIS — E1121 Type 2 diabetes mellitus with diabetic nephropathy: Secondary | ICD-10-CM | POA: Diagnosis not present

## 2024-02-26 DIAGNOSIS — N400 Enlarged prostate without lower urinary tract symptoms: Secondary | ICD-10-CM | POA: Diagnosis not present

## 2024-03-20 DIAGNOSIS — E1136 Type 2 diabetes mellitus with diabetic cataract: Secondary | ICD-10-CM | POA: Diagnosis not present

## 2024-03-20 DIAGNOSIS — E1121 Type 2 diabetes mellitus with diabetic nephropathy: Secondary | ICD-10-CM | POA: Diagnosis not present

## 2024-03-20 DIAGNOSIS — E1165 Type 2 diabetes mellitus with hyperglycemia: Secondary | ICD-10-CM | POA: Diagnosis not present

## 2024-03-20 DIAGNOSIS — I10 Essential (primary) hypertension: Secondary | ICD-10-CM | POA: Diagnosis not present

## 2024-03-27 DIAGNOSIS — I10 Essential (primary) hypertension: Secondary | ICD-10-CM | POA: Diagnosis not present

## 2024-03-27 DIAGNOSIS — E1121 Type 2 diabetes mellitus with diabetic nephropathy: Secondary | ICD-10-CM | POA: Diagnosis not present

## 2024-03-27 DIAGNOSIS — E1136 Type 2 diabetes mellitus with diabetic cataract: Secondary | ICD-10-CM | POA: Diagnosis not present

## 2024-03-27 DIAGNOSIS — E785 Hyperlipidemia, unspecified: Secondary | ICD-10-CM | POA: Diagnosis not present

## 2024-03-27 DIAGNOSIS — N400 Enlarged prostate without lower urinary tract symptoms: Secondary | ICD-10-CM | POA: Diagnosis not present

## 2024-03-27 DIAGNOSIS — E1165 Type 2 diabetes mellitus with hyperglycemia: Secondary | ICD-10-CM | POA: Diagnosis not present

## 2024-04-07 DIAGNOSIS — E1121 Type 2 diabetes mellitus with diabetic nephropathy: Secondary | ICD-10-CM | POA: Diagnosis not present

## 2024-04-07 DIAGNOSIS — E785 Hyperlipidemia, unspecified: Secondary | ICD-10-CM | POA: Diagnosis not present

## 2024-04-07 DIAGNOSIS — I10 Essential (primary) hypertension: Secondary | ICD-10-CM | POA: Diagnosis not present
